# Patient Record
Sex: Female | Born: 1990 | Race: White | Hispanic: No | Marital: Married | Smoking: Never smoker
Health system: Southern US, Community
[De-identification: ages and names within clinical notes are randomized; demographics above are authoritative.]

## PROBLEM LIST (undated history)

## (undated) DIAGNOSIS — Z789 Other specified health status: Secondary | ICD-10-CM

## (undated) HISTORY — PX: INDUCED ABORTION: SHX677

## (undated) HISTORY — PX: TONSILLECTOMY: SUR1361

## (undated) HISTORY — PX: OTHER SURGICAL HISTORY: SHX169

---
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
## 2012-09-05 ENCOUNTER — Emergency Department: Payer: Self-pay | Admitting: Emergency Medicine

## 2012-09-06 LAB — CBC
HGB: 12.7 g/dL (ref 12.0–16.0)
MCH: 29 pg (ref 26.0–34.0)
MCV: 85 fL (ref 80–100)
Platelet: 224 10*3/uL (ref 150–440)
WBC: 11.5 10*3/uL — ABNORMAL HIGH (ref 3.6–11.0)

## 2012-09-06 LAB — COMPREHENSIVE METABOLIC PANEL
Albumin: 3.5 g/dL (ref 3.4–5.0)
Alkaline Phosphatase: 62 U/L (ref 50–136)
BUN: 7 mg/dL (ref 7–18)
Creatinine: 0.68 mg/dL (ref 0.60–1.30)
EGFR (Non-African Amer.): >60
Glucose: 71 mg/dL (ref 65–99)
SGOT(AST): 15 U/L (ref 15–37)
Sodium: 137 mmol/L (ref 136–145)
Total Protein: 7.6 g/dL (ref 6.4–8.2)

## 2012-09-22 ENCOUNTER — Encounter: Payer: Self-pay | Admitting: Family Medicine

## 2012-10-22 ENCOUNTER — Encounter: Payer: Self-pay | Admitting: Family Medicine

## 2013-02-06 ENCOUNTER — Ambulatory Visit: Payer: Self-pay | Admitting: Orthopedic Surgery
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
## 2016-09-26 ENCOUNTER — Encounter: Payer: Self-pay | Admitting: Obstetrics & Gynecology

## 2016-10-04 ENCOUNTER — Other Ambulatory Visit: Payer: Self-pay | Admitting: Certified Nurse Midwife

## 2016-10-04 MED ORDER — ETONOGESTREL-ETHINYL ESTRADIOL 0.12-0.015 MG/24HR VA RING: 1.0000 | VAGINAL_RING | VAGINAL | 0 refills | Status: DC

## 2016-10-04 MED ORDER — ETONOGESTREL-ETHINYL ESTRADIOL 0.12-0.015 MG/24HR VA RING: VAGINAL_RING | VAGINAL | 12 refills | Status: DC

## 2016-10-04 NOTE — Telephone Encounter (Signed)
Tried to call patient to let her know rx sent in for one month. She needs to make follow up appt regarding birth control and needs pap due to unable to do at last visit. Pt's voicemail full.

## 2016-10-04 NOTE — Telephone Encounter (Signed)
Pt is calling today about her prescription she has lost and needs a refill on her Nurvaring. Pt uses [facility] on [address]. Please advise. cb# [phone]

## 2016-10-09 ENCOUNTER — Telehealth: Payer: Self-pay

## 2016-10-09 ENCOUNTER — Other Ambulatory Visit: Payer: Self-pay | Admitting: Certified Nurse Midwife

## 2016-10-09 ENCOUNTER — Ambulatory Visit: Payer: Self-pay | Admitting: Obstetrics & Gynecology

## 2016-10-09 NOTE — Telephone Encounter (Signed)
Refill this morning was denied due to it was a request for birth control pills. Pt received rx for nuva ring on 10/04/16 with 12 refills. Pt aware.

## 2016-10-09 NOTE — Telephone Encounter (Signed)
Pt calling re med refill was denied this am b/c she needs a f/u pap smear.  Her mom told her she only needed a pap q32yrs.  Was able to have annual except the pap.  If that's the case, she would rather not take time off work and pay a copay to come in.  Please call and advise.  [phone]

## 2017-11-17 ENCOUNTER — Other Ambulatory Visit: Payer: Self-pay | Admitting: Certified Nurse Midwife

## 2017-11-21 ENCOUNTER — Ambulatory Visit: Payer: Self-pay | Admitting: Obstetrics & Gynecology

## 2017-12-04 DIAGNOSIS — M5386 Other specified dorsopathies, lumbar region: Secondary | ICD-10-CM | POA: Diagnosis not present

## 2017-12-04 DIAGNOSIS — M9903 Segmental and somatic dysfunction of lumbar region: Secondary | ICD-10-CM | POA: Diagnosis not present

## 2017-12-04 DIAGNOSIS — M5417 Radiculopathy, lumbosacral region: Secondary | ICD-10-CM | POA: Diagnosis not present

## 2017-12-09 DIAGNOSIS — M9903 Segmental and somatic dysfunction of lumbar region: Secondary | ICD-10-CM | POA: Diagnosis not present

## 2017-12-09 DIAGNOSIS — M5386 Other specified dorsopathies, lumbar region: Secondary | ICD-10-CM | POA: Diagnosis not present

## 2017-12-09 DIAGNOSIS — M5417 Radiculopathy, lumbosacral region: Secondary | ICD-10-CM | POA: Diagnosis not present

## 2017-12-11 ENCOUNTER — Ambulatory Visit (INDEPENDENT_AMBULATORY_CARE_PROVIDER_SITE_OTHER): Payer: 59 | Admitting: Obstetrics & Gynecology

## 2017-12-11 ENCOUNTER — Encounter: Payer: Self-pay | Admitting: Obstetrics & Gynecology

## 2017-12-11 VITALS — BP 120/80 | Ht 64.5 in | Wt 208.0 lb

## 2017-12-11 DIAGNOSIS — Z0000 Encounter for general adult medical examination without abnormal findings: Secondary | ICD-10-CM

## 2017-12-11 DIAGNOSIS — Z124 Encounter for screening for malignant neoplasm of cervix: Secondary | ICD-10-CM

## 2017-12-11 DIAGNOSIS — Z01419 Encounter for gynecological examination (general) (routine) without abnormal findings: Secondary | ICD-10-CM | POA: Diagnosis not present

## 2017-12-11 MED ORDER — ETONOGESTREL-ETHINYL ESTRADIOL 0.12-0.015 MG/24HR VA RING: VAGINAL_RING | VAGINAL | 3 refills | Status: DC

## 2017-12-11 NOTE — Patient Instructions (Signed)
Ethinyl Estradiol; Etonogestrel vaginal ring What is this medicine? ETHINYL ESTRADIOL; ETONOGESTREL (ETH in il es tra DYE ole; et oh noe JES trel) vaginal ring is a flexible, vaginal ring used as a contraceptive (birth control method). This medicine combines two types of female hormones, an estrogen and a progestin. This ring is used to prevent ovulation and pregnancy. Each ring is effective for one month. This medicine may be used for other purposes; ask your health care provider or pharmacist if you have questions. COMMON BRAND NAME(S): NuvaRing What should I tell my health care provider before I take this medicine? They need to know if you have or ever had any of these conditions: -abnormal vaginal bleeding -blood vessel disease or blood clots -breast, cervical, endometrial, ovarian, liver, or uterine cancer -diabetes -gallbladder disease -heart disease or recent heart attack -high blood pressure -high cholesterol -kidney disease -liver disease -migraine headaches -stroke -systemic lupus erythematosus (SLE) -tobacco smoker -an unusual or allergic reaction to estrogens, progestins, other medicines, foods, dyes, or preservatives -pregnant or trying to get pregnant -breast-feeding How should I use this medicine? Insert the ring into your vagina as directed. Follow the directions on the prescription label. The ring will remain place for 3 weeks and is then removed for a 1-week break. A new ring is inserted 1 week after the last ring was removed, on the same day of the week. Check often to make sure the ring is still in place, especially before and after sexual intercourse. If the ring was out of the vagina for an unknown amount of time, you may not be protected from pregnancy. Perform a pregnancy test and call your doctor. Do not use more often than directed. A patient package insert for the product will be given with each prescription and refill. Read this sheet carefully each time. The  sheet may change frequently. Contact your pediatrician regarding the use of this medicine in children. Special care may be needed. This medicine has been used in female children who have started having menstrual periods. Overdosage: If you think you have taken too much of this medicine contact a poison control center or emergency room at once. NOTE: This medicine is only for you. Do not share this medicine with others. What if I miss a dose? You will need to replace your vaginal ring once a month as directed. If the ring should slip out, or if you leave it in longer or shorter than you should, contact your health care professional for advice. What may interact with this medicine? Do not take this medicine with the following medication: -dasabuvir; ombitasvir; paritaprevir; ritonavir -ombitasvir; paritaprevir; ritonavir This medicine may also interact with the following medications: -acetaminophen -antibiotics or medicines for infections, especially rifampin, rifabutin, rifapentine, and griseofulvin, and possibly penicillins or tetracyclines -aprepitant -ascorbic acid (vitamin C) -atorvastatin -barbiturate medicines, such as phenobarbital -bosentan -carbamazepine -caffeine -clofibrate -cyclosporine -dantrolene -doxercalciferol -felbamate -grapefruit juice -hydrocortisone -medicines for anxiety or sleeping problems, such as diazepam or temazepam -medicines for diabetes, including pioglitazone -modafinil -mycophenolate -nefazodone -oxcarbazepine -phenytoin -prednisolone -ritonavir or other medicines for HIV infection or AIDS -rosuvastatin -selegiline -soy isoflavones supplements -St. John's wort -tamoxifen or raloxifene -theophylline -thyroid hormones -topiramate -warfarin This list may not describe all possible interactions. Give your health care provider a list of all the medicines, herbs, non-prescription drugs, or dietary supplements you use. Also tell them if you smoke,  drink alcohol, or use illegal drugs. Some items may interact with your medicine. What should I watch for while using   this medicine? Visit your doctor or health care professional for regular checks on your progress. You will need a regular breast and pelvic exam and Pap smear while on this medicine. Use an additional method of contraception during the first cycle that you use this ring. Do not use a diaphragm or female condom, as the ring can interfere with these birth control methods and their proper placement. If you have any reason to think you are pregnant, stop using this medicine right away and contact your doctor or health care professional. If you are using this medicine for hormone related problems, it may take several cycles of use to see improvement in your condition. Smoking increases the risk of getting a blood clot or having a stroke while you are using hormonal birth control, especially if you are more than 27 years old. You are strongly advised not to smoke. This medicine can make your body retain fluid, making your fingers, hands, or ankles swell. Your blood pressure can go up. Contact your doctor or health care professional if you feel you are retaining fluid. This medicine can make you more sensitive to the sun. Keep out of the sun. If you cannot avoid being in the sun, wear protective clothing and use sunscreen. Do not use sun lamps or tanning beds/booths. If you wear contact lenses and notice visual changes, or if the lenses begin to feel uncomfortable, consult your eye care specialist. In some women, tenderness, swelling, or minor bleeding of the gums may occur. Notify your dentist if this happens. Brushing and flossing your teeth regularly may help limit this. See your dentist regularly and inform your dentist of the medicines you are taking. If you are going to have elective surgery, you may need to stop using this medicine before the surgery. Consult your health care professional  for advice. This medicine does not protect you against HIV infection (AIDS) or any other sexually transmitted diseases. What side effects may I notice from receiving this medicine? Side effects that you should report to your doctor or health care professional as soon as possible: -breast tissue changes or discharge -changes in vaginal bleeding during your period or between your periods -chest pain -coughing up blood -dizziness or fainting spells -headaches or migraines -leg, arm or groin pain -severe or sudden headaches -stomach pain (severe) -sudden shortness of breath -sudden loss of coordination, especially on one side of the body -speech problems -symptoms of vaginal infection like itching, irritation or unusual discharge -tenderness in the upper abdomen -vomiting -weakness or numbness in the arms or legs, especially on one side of the body -yellowing of the eyes or skin Side effects that usually do not require medical attention (report to your doctor or health care professional if they continue or are bothersome): -breakthrough bleeding and spotting that continues beyond the 3 initial cycles of pills -breast tenderness -mood changes, anxiety, depression, frustration, anger, or emotional outbursts -increased sensitivity to sun or ultraviolet light -nausea -skin rash, acne, or brown spots on the skin -weight gain (slight) This list may not describe all possible side effects. Call your doctor for medical advice about side effects. You may report side effects to FDA at 1-800-FDA-1088. Where should I keep my medicine? Keep out of the reach of children. Store at room temperature between 15 and 30 degrees C (59 and 86 degrees F) for up to 4 months. The product will expire after 4 months. Protect from light. Throw away any unused medicine after the expiration date. NOTE: This   sheet is a summary. It may not cover all possible information. If you have questions about this medicine, talk  to your doctor, pharmacist, or health care provider.  2018 Elsevier/Gold Standard (2016-02-16 17:00:31)  

## 2017-12-11 NOTE — Progress Notes (Signed)
HPI:      Lauren Duran is a 27 y.o. G1P0010 who LMP was Patient's last menstrual period was 11/27/2017., she presents today for her annual examination. The patient has no complaints today. The patient is sexually active. Her last pap: was normal. The patient does perform self breast exams.  There is no notable family history of breast or ovarian cancer in her family.  The patient has regular exercise: yes.  The patient denies current symptoms of depression.    GYN History: Contraception: NuvaRing vaginal inserts  PMHx: History reviewed. No pertinent past medical history. Past Surgical History:  Procedure Laterality Date  . INDUCED ABORTION    . TONSILLECTOMY     Family History  Problem Relation Age of Onset  . Heart disease Father   . Hypertension Father   . Heart disease Maternal Grandfather    Social History   Tobacco Use  . Smoking status: Never Smoker  . Smokeless tobacco: Never Used  Substance Use Topics  . Alcohol use: No  . Drug use: No    Current Outpatient Medications:  .  etonogestrel-ethinyl estradiol (NUVARING) 0.12-0.015 MG/24HR vaginal ring, Insert vaginally and leave in place for 3 consecutive weeks, then remove for 1 week., Disp: 3 each, Rfl: 3 Allergies: Patient has no known allergies.  Review of Systems  Constitutional: Negative for chills, fever and malaise/fatigue.  HENT: Negative for congestion, sinus pain and sore throat.   Eyes: Negative for blurred vision and pain.  Respiratory: Negative for cough and wheezing.   Cardiovascular: Negative for chest pain and leg swelling.  Gastrointestinal: Negative for abdominal pain, constipation, diarrhea, heartburn, nausea and vomiting.  Genitourinary: Negative for dysuria, frequency, hematuria and urgency.  Musculoskeletal: Negative for back pain, joint pain, myalgias and neck pain.  Skin: Negative for itching and rash.  Neurological: Negative for dizziness, tremors and weakness.    Endo/Heme/Allergies: Does not bruise/bleed easily.  Psychiatric/Behavioral: Negative for depression. The patient is not nervous/anxious and does not have insomnia.     Objective: BP 120/80   Ht 5' 4.5" (1.638 m)   Wt 208 lb (94.3 kg)   LMP 11/27/2017   BMI 35.15 kg/m   Filed Weights   12/11/17 0815  Weight: 208 lb (94.3 kg)   Body mass index is 35.15 kg/m. Physical Exam  Constitutional: She is oriented to person, place, and time. She appears well-developed and well-nourished. No distress.  Genitourinary: Rectum normal, vagina normal and uterus normal. Pelvic exam was performed with patient supine. There is no rash or lesion on the right labia. There is no rash or lesion on the left labia. Vagina exhibits no lesion. No bleeding in the vagina. Right adnexum does not display mass and does not display tenderness. Left adnexum does not display mass and does not display tenderness. Cervix does not exhibit motion tenderness, lesion, friability or polyp.   Uterus is mobile and midaxial. Uterus is not enlarged or exhibiting a mass.  HENT:  Head: Normocephalic and atraumatic. Head is without laceration.  Right Ear: Hearing normal.  Left Ear: Hearing normal.  Nose: No epistaxis.  No foreign bodies.  Mouth/Throat: Uvula is midline, oropharynx is clear and moist and mucous membranes are normal.  Eyes: Pupils are equal, round, and reactive to light.  Neck: Normal range of motion. Neck supple. No thyromegaly present.  Cardiovascular: Normal rate and regular rhythm. Exam reveals no gallop and no friction rub.  No murmur heard. Pulmonary/Chest: Effort normal and breath sounds normal. No respiratory distress.  She has no wheezes. Right breast exhibits no mass, no skin change and no tenderness. Left breast exhibits no mass, no skin change and no tenderness.  Abdominal: Soft. Bowel sounds are normal. She exhibits no distension. There is no tenderness. There is no rebound.  Musculoskeletal: Normal range  of motion.  Neurological: She is alert and oriented to person, place, and time. No cranial nerve deficit.  Skin: Skin is warm and dry.  Psychiatric: She has a normal mood and affect. Judgment normal.  Vitals reviewed.   Assessment:  ANNUAL EXAM 1. Annual physical exam   2. Screening for cervical cancer      Screening Plan:            1.  Cervical Screening-  Pap smear done today  2. Breast screening- Exam annually and mammogram>40 planned   3. Colonoscopy every 10 years, Hemoccult testing - after age 11  4. Labs managed by PCP  5. Counseling for contraception: NuvaRing vaginal inserts     F/U  No follow-ups on file.  Annamarie Major, MD, [facility], [facility] 12/11/2017  8:41 AM

## 2017-12-15 DIAGNOSIS — M9903 Segmental and somatic dysfunction of lumbar region: Secondary | ICD-10-CM | POA: Diagnosis not present

## 2017-12-15 DIAGNOSIS — M5386 Other specified dorsopathies, lumbar region: Secondary | ICD-10-CM | POA: Diagnosis not present

## 2017-12-15 DIAGNOSIS — M5417 Radiculopathy, lumbosacral region: Secondary | ICD-10-CM | POA: Diagnosis not present

## 2017-12-15 LAB — IGP, RFX APTIMA HPV ASCU: PAP Smear Comment: 0

## 2017-12-18 DIAGNOSIS — M5386 Other specified dorsopathies, lumbar region: Secondary | ICD-10-CM | POA: Diagnosis not present

## 2017-12-18 DIAGNOSIS — M5417 Radiculopathy, lumbosacral region: Secondary | ICD-10-CM | POA: Diagnosis not present

## 2017-12-18 DIAGNOSIS — M9903 Segmental and somatic dysfunction of lumbar region: Secondary | ICD-10-CM | POA: Diagnosis not present

## 2017-12-24 DIAGNOSIS — M9903 Segmental and somatic dysfunction of lumbar region: Secondary | ICD-10-CM | POA: Diagnosis not present

## 2017-12-24 DIAGNOSIS — M9905 Segmental and somatic dysfunction of pelvic region: Secondary | ICD-10-CM | POA: Diagnosis not present

## 2017-12-24 DIAGNOSIS — M5386 Other specified dorsopathies, lumbar region: Secondary | ICD-10-CM | POA: Diagnosis not present

## 2018-05-12 DIAGNOSIS — H6691 Otitis media, unspecified, right ear: Secondary | ICD-10-CM | POA: Diagnosis not present

## 2018-05-12 DIAGNOSIS — J069 Acute upper respiratory infection, unspecified: Secondary | ICD-10-CM | POA: Diagnosis not present

## 2018-06-03 DIAGNOSIS — J301 Allergic rhinitis due to pollen: Secondary | ICD-10-CM | POA: Diagnosis not present

## 2018-08-06 DIAGNOSIS — M9904 Segmental and somatic dysfunction of sacral region: Secondary | ICD-10-CM | POA: Diagnosis not present

## 2018-08-06 DIAGNOSIS — M5442 Lumbago with sciatica, left side: Secondary | ICD-10-CM | POA: Diagnosis not present

## 2018-08-06 DIAGNOSIS — M9903 Segmental and somatic dysfunction of lumbar region: Secondary | ICD-10-CM | POA: Diagnosis not present

## 2018-08-07 DIAGNOSIS — M5442 Lumbago with sciatica, left side: Secondary | ICD-10-CM | POA: Diagnosis not present

## 2018-08-07 DIAGNOSIS — M9903 Segmental and somatic dysfunction of lumbar region: Secondary | ICD-10-CM | POA: Diagnosis not present

## 2018-08-07 DIAGNOSIS — M9904 Segmental and somatic dysfunction of sacral region: Secondary | ICD-10-CM | POA: Diagnosis not present

## 2018-08-11 DIAGNOSIS — M9903 Segmental and somatic dysfunction of lumbar region: Secondary | ICD-10-CM | POA: Diagnosis not present

## 2018-08-11 DIAGNOSIS — M5442 Lumbago with sciatica, left side: Secondary | ICD-10-CM | POA: Diagnosis not present

## 2018-08-11 DIAGNOSIS — M9904 Segmental and somatic dysfunction of sacral region: Secondary | ICD-10-CM | POA: Diagnosis not present

## 2018-08-13 DIAGNOSIS — M5442 Lumbago with sciatica, left side: Secondary | ICD-10-CM | POA: Diagnosis not present

## 2018-08-13 DIAGNOSIS — M9903 Segmental and somatic dysfunction of lumbar region: Secondary | ICD-10-CM | POA: Diagnosis not present

## 2018-08-13 DIAGNOSIS — M9904 Segmental and somatic dysfunction of sacral region: Secondary | ICD-10-CM | POA: Diagnosis not present

## 2018-11-06 ENCOUNTER — Ambulatory Visit (INDEPENDENT_AMBULATORY_CARE_PROVIDER_SITE_OTHER): Payer: 59

## 2018-11-06 ENCOUNTER — Ambulatory Visit: Payer: 59 | Admitting: Podiatry

## 2018-11-06 ENCOUNTER — Encounter: Payer: Self-pay | Admitting: Podiatry

## 2018-11-06 ENCOUNTER — Other Ambulatory Visit: Payer: Self-pay

## 2018-11-06 VITALS — Temp 98.5°F

## 2018-11-06 DIAGNOSIS — M722 Plantar fascial fibromatosis: Secondary | ICD-10-CM

## 2018-11-06 DIAGNOSIS — M2170 Unequal limb length (acquired), unspecified site: Secondary | ICD-10-CM | POA: Diagnosis not present

## 2018-11-06 DIAGNOSIS — M21611 Bunion of right foot: Secondary | ICD-10-CM

## 2018-11-06 MED ORDER — METHYLPREDNISOLONE 4 MG PO TBPK: ORAL_TABLET | ORAL | 0 refills | Status: DC

## 2018-11-06 MED ORDER — MELOXICAM 15 MG PO TABS: 15.0000 mg | ORAL_TABLET | Freq: Every day | ORAL | 1 refills | Status: DC

## 2018-11-09 NOTE — Progress Notes (Signed)
   Subjective: 28 year old female with PMHx of cerebral palsy presenting today as a new patient with a chief complaint of bilateral foot pain that has been ongoing for the past several years. 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 She states in the last 6 months the pain in her posterior left heel and lateral left foot has started to become more constant. She also reports pain in the right foot secondary to a bunion deformity. She states the pain in the feet is worse in the morning when she first wakes up and by the end of the day. Being on the feet increases the pain. She has not had any recent treatment for her symptoms. Patient is here for further evaluation and treatment.   History reviewed. No pertinent past medical history.    Objective: Physical Exam General: The patient is alert and oriented x3 in no acute distress.  Dermatology: Skin is cool, dry and supple bilateral lower extremities. Negative for open lesions or macerations.  Vascular: Palpable pedal pulses bilaterally. No edema or erythema noted. Capillary refill within normal limits.  Neurological: Epicritic and protective threshold grossly intact bilaterally.   Musculoskeletal Exam: Clinical evidence of bunion deformity noted to the respective foot. There is moderate pain on palpation range of motion of the first MPJ. Lateral deviation of the hallux noted consistent with hallux abductovalgus. Hammertoe contracture also noted on clinical exam to digits 2 and 3 of the right foot. Symptomatic pain on palpation and range of motion also noted to the metatarsal phalangeal joints of the respective hammertoe digits.   Tenderness to palpation to the plantar aspect of the left heel along the plantar fascia. Right lower extremity approximately 1 cm shorter than the left lower extremity.   Radiographic Exam: Increased intermetatarsal angle greater than 15 with a hallux  abductus angle greater than 30 noted on AP view. Moderate degenerative changes noted within the first MPJ. Contracture deformity also noted to the interphalangeal joints and MPJs of the digits of the respective hammertoes.    Assessment: 1. HAV w/ bunion deformity right 2. Hammertoe deformity digits 2, 3 right 3. Plantar fasciitis left 4. 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 H/o CP affecting RLE    Plan of Care:  1. Patient was evaluated. X-Rays reviewed. 2. Injection of 0.5 mLs Celestone Soluspan injected into the left plantar fascia.  3. Prescription for Medrol Dose Pak provided to patient. 4. Prescription for Meloxicam provided to patient. 5. Appointment with Raiford Noble, Pedorthist, for custom molded orthotics.  6. Plantar fascial brace dispensed.  7. Return to clinic in 4 weeks.   Works for [employer] Getting married in September.    Felecia Shelling, DPM [facility]  Dr. Felecia Shelling, DPM    [address]                                        [place], [place] [address]                Office [phone]  Fax [phone]

## 2018-11-11 ENCOUNTER — Other Ambulatory Visit: Payer: Self-pay

## 2018-11-11 ENCOUNTER — Other Ambulatory Visit: Payer: 59 | Admitting: Orthotics

## 2018-11-11 ENCOUNTER — Ambulatory Visit (INDEPENDENT_AMBULATORY_CARE_PROVIDER_SITE_OTHER): Payer: 59 | Admitting: Orthotics

## 2018-11-11 DIAGNOSIS — M722 Plantar fascial fibromatosis: Secondary | ICD-10-CM

## 2018-11-11 DIAGNOSIS — M21611 Bunion of right foot: Secondary | ICD-10-CM

## 2018-11-11 DIAGNOSIS — M2170 Unequal limb length (acquired), unspecified site: Secondary | ICD-10-CM

## 2018-11-11 NOTE — Progress Notes (Signed)
Patient came into today for casting bilateral f/o to address plantar fasciitis.  Patient reports history of foot pain involving plantar aponeurosis.  Goal is to provide longitudinal arch support and correct any RF instability due to heel eversion/inversion.  Ultimate goal is to relieve tension at pf insertion calcaneal tuberosity.  Plan on semi-rigid device addressing heel stability and relieving PF tension.     Patient also has 1/4" left leg length discrency; so will raise right 1/4" and also offload RT MPJ.Marland Kitchen

## 2018-12-02 ENCOUNTER — Other Ambulatory Visit: Payer: 59 | Admitting: Orthotics

## 2018-12-08 ENCOUNTER — Other Ambulatory Visit: Payer: Self-pay | Admitting: Obstetrics & Gynecology

## 2018-12-09 ENCOUNTER — Ambulatory Visit: Payer: 59 | Admitting: Orthotics

## 2018-12-09 ENCOUNTER — Other Ambulatory Visit: Payer: Self-pay

## 2018-12-09 DIAGNOSIS — M2170 Unequal limb length (acquired), unspecified site: Secondary | ICD-10-CM

## 2018-12-09 DIAGNOSIS — M722 Plantar fascial fibromatosis: Secondary | ICD-10-CM

## 2018-12-09 NOTE — Progress Notes (Signed)
Patient came in today to pick up custom made foot orthotics.  The goals were accomplished and the patient reported no dissatisfaction with said orthotics.  Patient was advised of breakin period and how to report any issues. 

## 2018-12-11 ENCOUNTER — Ambulatory Visit: Payer: 59 | Admitting: Podiatry

## 2018-12-28 ENCOUNTER — Ambulatory Visit: Payer: 59 | Admitting: Obstetrics & Gynecology

## 2018-12-30 ENCOUNTER — Ambulatory Visit: Payer: 59 | Admitting: Obstetrics & Gynecology

## 2018-12-31 ENCOUNTER — Other Ambulatory Visit: Payer: Self-pay | Admitting: Podiatry

## 2018-12-31 NOTE — Telephone Encounter (Signed)
Called pt. Advising of Meloxicam refill sent to her pharmacy

## 2018-12-31 NOTE — Telephone Encounter (Signed)
Dr. Price as Dr. On call please advice 

## 2019-02-04 ENCOUNTER — Ambulatory Visit: Payer: 59 | Admitting: Obstetrics & Gynecology

## 2019-03-03 ENCOUNTER — Other Ambulatory Visit: Payer: Self-pay | Admitting: Podiatry

## 2019-03-23 ENCOUNTER — Telehealth: Payer: Self-pay | Admitting: Obstetrics & Gynecology

## 2019-03-23 ENCOUNTER — Other Ambulatory Visit: Payer: Self-pay

## 2019-03-23 MED ORDER — ETONOGESTREL-ETHINYL ESTRADIOL 0.12-0.015 MG/24HR VA RING: VAGINAL_RING | VAGINAL | 3 refills | Status: DC

## 2019-03-23 NOTE — Telephone Encounter (Signed)
Done

## 2019-03-23 NOTE — Telephone Encounter (Signed)
Patient is schedule for 04/30/19 with [facility] for annual. Patient is requesting refill on nuvaring. Please advise

## 2019-04-19 ENCOUNTER — Ambulatory Visit: Payer: 59 | Admitting: Obstetrics & Gynecology

## 2019-04-30 ENCOUNTER — Other Ambulatory Visit: Payer: Self-pay

## 2019-04-30 ENCOUNTER — Other Ambulatory Visit (HOSPITAL_COMMUNITY)
Admission: RE | Admit: 2019-04-30 | Discharge: 2019-04-30 | Disposition: A | Discharge status: Home / Self Care | Payer: 59 | Source: Ambulatory Visit | Attending: Obstetrics & Gynecology | Admitting: Obstetrics & Gynecology

## 2019-04-30 ENCOUNTER — Ambulatory Visit (INDEPENDENT_AMBULATORY_CARE_PROVIDER_SITE_OTHER): Payer: 59 | Admitting: Obstetrics & Gynecology

## 2019-04-30 ENCOUNTER — Encounter: Payer: Self-pay | Admitting: Obstetrics & Gynecology

## 2019-04-30 VITALS — BP 128/80 | Ht 66.0 in | Wt 224.0 lb

## 2019-04-30 DIAGNOSIS — Z124 Encounter for screening for malignant neoplasm of cervix: Secondary | ICD-10-CM | POA: Insufficient documentation

## 2019-04-30 DIAGNOSIS — Z01419 Encounter for gynecological examination (general) (routine) without abnormal findings: Secondary | ICD-10-CM

## 2019-04-30 DIAGNOSIS — Z131 Encounter for screening for diabetes mellitus: Secondary | ICD-10-CM

## 2019-04-30 DIAGNOSIS — Z13220 Encounter for screening for lipoid disorders: Secondary | ICD-10-CM

## 2019-04-30 DIAGNOSIS — Z1329 Encounter for screening for other suspected endocrine disorder: Secondary | ICD-10-CM

## 2019-04-30 MED ORDER — ETONOGESTREL-ETHINYL ESTRADIOL 0.12-0.015 MG/24HR VA RING: VAGINAL_RING | VAGINAL | 3 refills | Status: DC

## 2019-04-30 NOTE — Progress Notes (Signed)
HPI:      [name] is a 28 y.o. G1P0010 who LMP was Patient's last menstrual period was 04/09/2019., she presents today for her annual examination. The patient has no complaints today. The patient is sexually active. Her last pap: approximate date 2019 and was normal. The patient does perform self breast exams.  There is no notable family history of breast or ovarian cancer in her family.  The patient has regular exercise: yes.  The patient denies current symptoms of depression.    GYN History: Contraception: NuvaRing vaginal inserts  PMHx: History reviewed. No pertinent past medical history. Past Surgical History:  Procedure Laterality Date  . INDUCED ABORTION    . TONSILLECTOMY     Family History  Problem Relation Age of Onset  . Heart disease Father   . Hypertension Father   . Heart disease Maternal Grandfather    Social History   Tobacco Use  . Smoking status: Never Smoker  . Smokeless tobacco: Never Used  Substance Use Topics  . Alcohol use: No  . Drug use: No    Current Outpatient Medications:  .  etonogestrel-ethinyl estradiol (NUVARING) 0.12-0.015 MG/24HR vaginal ring, INSERT 1 RING VAGINALLY AS DIRECTED. REMOVE AFTER 3 WEEKS & WAIT 7 DAYS BEFORE INSERTING A NEW RING, Disp: 3 each, Rfl: 3 .  meloxicam (MOBIC) 15 MG tablet, TAKE 1 TABLET BY MOUTH EVERY DAY (Patient not taking: Reported on 04/30/2019), Disp: 30 tablet, Rfl: 1 .  methylPREDNISolone (MEDROL DOSEPAK) 4 MG TBPK tablet, 6 day dose pack - take as directed (Patient not taking: Reported on 04/30/2019), Disp: 21 tablet, Rfl: 0 Allergies: Patient has no known allergies.  Review of Systems  Constitutional: Negative for chills, fever and malaise/fatigue.  HENT: Negative for congestion, sinus pain and sore throat.   Eyes: Negative for blurred vision and pain.  Respiratory: Negative for cough and wheezing.   Cardiovascular: Negative for chest pain and leg swelling.  Gastrointestinal: Negative for  abdominal pain, constipation, diarrhea, heartburn, nausea and vomiting.  Genitourinary: Negative for dysuria, frequency, hematuria and urgency.  Musculoskeletal: Negative for back pain, joint pain, myalgias and neck pain.  Skin: Negative for itching and rash.  Neurological: Negative for dizziness, tremors and weakness.  Endo/Heme/Allergies: Does not bruise/bleed easily.  Psychiatric/Behavioral: Negative for depression. The patient is not nervous/anxious and does not have insomnia.     Objective: BP 128/80   Ht 5\' 6"  (1.676 m)   Wt 224 lb (101.6 kg)   LMP 04/09/2019   BMI 36.15 kg/m   Filed Weights   04/30/19 0758  Weight: 224 lb (101.6 kg)   Body mass index is 36.15 kg/m. Physical Exam Constitutional:      General: She is not in acute distress.    Appearance: She is well-developed.  Genitourinary:     Pelvic exam was performed with patient supine.     Vagina, uterus and rectum normal.     No lesions in the vagina.     No vaginal bleeding.     No cervical motion tenderness, friability, lesion or polyp.     Uterus is mobile.     Uterus is not enlarged.     No uterine mass detected.    Uterus is midaxial.     No right or left adnexal mass present.     Right adnexa not tender.     Left adnexa not tender.  HENT:     Head: Normocephalic and atraumatic. No laceration.     Right  Ear: Hearing normal.     Left Ear: Hearing normal.     Mouth/Throat:     Pharynx: Uvula midline.  Eyes:     Pupils: Pupils are equal, round, and reactive to light.  Neck:     Musculoskeletal: Normal range of motion and neck supple.     Thyroid: No thyromegaly.  Cardiovascular:     Rate and Rhythm: Normal rate and regular rhythm.     Heart sounds: No murmur. No friction rub. No gallop.   Pulmonary:     Effort: Pulmonary effort is normal. No respiratory distress.     Breath sounds: Normal breath sounds. No wheezing.  Chest:     Breasts:        Right: No mass, skin change or tenderness.         Left: No mass, skin change or tenderness.  Abdominal:     General: Bowel sounds are normal. There is no distension.     Palpations: Abdomen is soft.     Tenderness: There is no abdominal tenderness. There is no rebound.  Musculoskeletal: Normal range of motion.  Neurological:     Mental Status: She is alert and oriented to person, place, and time.     Cranial Nerves: No cranial nerve deficit.  Skin:    General: Skin is warm and dry.  Psychiatric:        Judgment: Judgment normal.  Vitals signs reviewed.     Assessment:  ANNUAL EXAM 1. Women's annual routine gynecological examination   2. Screening for cervical cancer      Screening Plan:            1.  Cervical Screening-  Pap smear done today  2. Breast screening- Exam annually and mammogram>40 planned   3. Labs Ordered today  4. Counseling for contraception: NuvaRing vaginal inserts      F/U  Return in about 1 year (around 04/29/2020) for Annual.  Annamarie Major, MD, [facility], [facility] 04/30/2019  8:00 AM

## 2019-04-30 NOTE — Patient Instructions (Addendum)
PAP every three years Labs today  

## 2019-05-01 LAB — LIPID PANEL
Chol/HDL Ratio: 3.7 ratio (ref 0.0–4.4)
Cholesterol, Total: 176 mg/dL (ref 100–199)
HDL: 47 mg/dL (ref >39)
LDL Chol Calc (NIH): 104 mg/dL — ABNORMAL HIGH (ref 0–99)
Triglycerides: 144 mg/dL (ref 0–149)
VLDL Cholesterol Cal: 25 mg/dL (ref 5–40)

## 2019-05-01 LAB — TSH: TSH: 1.56 u[IU]/mL (ref 0.450–4.500)

## 2019-05-01 LAB — GLUCOSE, FASTING: Glucose, Plasma: 78 mg/dL (ref 65–99)

## 2019-05-03 LAB — CYTOLOGY - PAP: Diagnosis: NEGATIVE

## 2020-05-16 ENCOUNTER — Other Ambulatory Visit: Payer: Self-pay | Admitting: Obstetrics & Gynecology

## 2020-05-16 NOTE — Telephone Encounter (Signed)
Patient is scheduled for 07/06/19 with [facility]

## 2020-05-16 NOTE — Telephone Encounter (Signed)
Sch Annual.  Refill until then.

## 2020-07-05 ENCOUNTER — Ambulatory Visit: Payer: 59 | Admitting: Obstetrics & Gynecology

## 2020-07-21 ENCOUNTER — Ambulatory Visit: Payer: Self-pay | Admitting: Obstetrics & Gynecology

## 2020-08-01 ENCOUNTER — Other Ambulatory Visit: Payer: Self-pay | Admitting: Obstetrics & Gynecology

## 2020-08-07 ENCOUNTER — Ambulatory Visit: Payer: Self-pay | Admitting: Obstetrics & Gynecology

## 2020-10-28 ENCOUNTER — Other Ambulatory Visit: Payer: Self-pay | Admitting: Obstetrics & Gynecology

## 2020-10-30 NOTE — Telephone Encounter (Signed)
Needs appt (annual before any more refills allowed at this point.  Please arrange

## 2021-02-16 ENCOUNTER — Ambulatory Visit (INDEPENDENT_AMBULATORY_CARE_PROVIDER_SITE_OTHER): Payer: 59

## 2021-02-16 ENCOUNTER — Encounter: Payer: Self-pay | Admitting: Podiatry

## 2021-02-16 ENCOUNTER — Ambulatory Visit (INDEPENDENT_AMBULATORY_CARE_PROVIDER_SITE_OTHER): Payer: Self-pay | Admitting: Podiatry

## 2021-02-16 ENCOUNTER — Other Ambulatory Visit: Payer: Self-pay

## 2021-02-16 DIAGNOSIS — M2011 Hallux valgus (acquired), right foot: Secondary | ICD-10-CM | POA: Diagnosis not present

## 2021-02-16 DIAGNOSIS — M21611 Bunion of right foot: Secondary | ICD-10-CM | POA: Diagnosis not present

## 2021-02-16 DIAGNOSIS — Q66221 Congenital metatarsus adductus, right foot: Secondary | ICD-10-CM

## 2021-02-20 NOTE — Progress Notes (Signed)
   Subjective: 30 y.o. female PMHx cerebral palsy affecting the RLE presents today as a new patient for evaluation of symptomatic painful bunion deformities to the right foot that is been going on increasingly especially for the past year.  Patient has a longstanding history of a bunion deformity to the right great toe.  It is now hurting more in the joint and aggravated by walking and wearing shoes despite conservative treatment such as shoe gear modifications.  She has been taking ibuprofen and icing her foot as well as hot foot soaks to try to alleviate the pain.  Unfortunately it is painful consistently throughout the day now.  She presents for further treatment and evaluation and discuss possible surgery.   No past medical history on file.  Objective: Physical Exam General: The patient is alert and oriented x3 in no acute distress.  Dermatology: Skin is cool, dry and supple bilateral lower extremities. Negative for open lesions or macerations.  Vascular: Palpable pedal pulses bilaterally. No edema or erythema noted. Capillary refill within normal limits.  Neurological: Epicritic and protective threshold grossly intact bilaterally.   Musculoskeletal Exam: Clinical evidence of bunion deformity noted to the respective foot. There is moderate pain on palpation range of motion of the first MPJ. Lateral deviation of the hallux noted consistent with hallux abductovalgus.  Radiographic Exam: Increased intermetatarsal angle greater than 15 with a hallux abductus angle greater than 30 noted on AP view. Moderate degenerative changes noted within the first MPJ. 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  Assessment: 1. 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 Patient was evaluated. X-Rays reviewed. 2. Today we discussed the conservative versus surgical management of the presenting  pathology. The patient opts for surgical management. All possible complications and details of the procedure were explained. All patient questions were answered. No guarantees were expressed or implied.  I explained the Lapiplasty and Adductoplasty procedures to the patient and the need to address the metatarsus adductus to allow for more rectus foot alignment and better correction of the bunion deformity.  Patient understands. 3. Authorization for surgery was initiated today. Surgery will consist of Lapidus bunionectomy right.  Adductoplasty TMT arthrodesis right. 4.  Return to clinic 1 week postop    Felecia Shelling, DPM [facility]  Dr. Felecia Shelling, DPM    [address] [place], [place] [address]                Office [phone]  Fax [phone]

## 2021-03-27 ENCOUNTER — Emergency Department: Payer: 59

## 2021-03-27 ENCOUNTER — Other Ambulatory Visit: Payer: Self-pay

## 2021-03-27 ENCOUNTER — Emergency Department
Admission: EM | Admit: 2021-03-27 | Discharge: 2021-03-27 | Disposition: A | Discharge status: Home / Self Care | Payer: 59 | Attending: Emergency Medicine | Admitting: Emergency Medicine

## 2021-03-27 ENCOUNTER — Ambulatory Visit: Payer: 59

## 2021-03-27 DIAGNOSIS — M79605 Pain in left leg: Secondary | ICD-10-CM

## 2021-03-27 DIAGNOSIS — M79662 Pain in left lower leg: Secondary | ICD-10-CM

## 2021-03-27 LAB — CBC
HCT: 44.8 % (ref 36.0–46.0)
Hemoglobin: 15.1 g/dL — ABNORMAL HIGH (ref 12.0–15.0)
MCH: 29.3 pg (ref 26.0–34.0)
MCHC: 33.7 g/dL (ref 30.0–36.0)
MCV: 87 fL (ref 80.0–100.0)
Platelets: 291 10*3/uL (ref 150–400)
RBC: 5.15 MIL/uL — ABNORMAL HIGH (ref 3.87–5.11)
RDW: 12.4 % (ref 11.5–15.5)
WBC: 6.8 10*3/uL (ref 4.0–10.5)
nRBC: 0 % (ref 0.0–0.2)

## 2021-03-27 LAB — BASIC METABOLIC PANEL
Anion gap: 8 (ref 5–15)
BUN: 11 mg/dL (ref 6–20)
CO2: 26 mmol/L (ref 22–32)
Calcium: 9.6 mg/dL (ref 8.9–10.3)
Chloride: 105 mmol/L (ref 98–111)
Creatinine, Ser: 0.94 mg/dL (ref 0.44–1.00)
GFR, Estimated: >60 mL/min (ref >60)
Glucose, Bld: 103 mg/dL — ABNORMAL HIGH (ref 70–99)
Potassium: 4.4 mmol/L (ref 3.5–5.1)
Sodium: 139 mmol/L (ref 135–145)

## 2021-03-27 LAB — D-DIMER, QUANTITATIVE: D-Dimer, Quant: <0.27 ug/mL-FEU (ref 0.00–0.50)

## 2021-03-27 NOTE — ED Triage Notes (Signed)
Pt here with left leg pain that started 2 weeks ago but has gotten worse over time. Pt was sent here from her primary doctor's office to rule out a blood clot. Pt states that the pain started in her calf but has reached her toes.

## 2021-03-27 NOTE — ED Notes (Signed)
See triage note  presents with some pain to left leg  states pain started about 2 weeks ago  no injury  pain is posterior calf and moves to her foot  no injury

## 2021-03-27 NOTE — ED Provider Notes (Signed)
[facility] Emergency Department Provider Note   ____________________________________________   Event Date/Time   First MD Initiated Contact with Patient 03/27/21 [phone]     (approximate)  I have reviewed the triage vital signs and the nursing notes.   HISTORY  Chief Complaint Leg Pain    HPI Lauren Duran is a 30 y.o. female with no significant past medical history who presents to the ED complaining of leg pain.  Patient reports that she has had 2 weeks of increasing pain in her left calf.  She has not noticed any swelling or skin changes.  Pain has progressively moved down from her calf and will shoot into the top of her left foot as well as into her toes.  Pain is worse when she bears weight on her left leg, she denies any specific trauma but does state she ran into an obstacle course shortly before onset of pain.  She has never had similar pain in the past and denies any history of DVT/PE.  She initially presented to her PCPs office, was referred to the ED for rule out of blood clot.        No past medical history on file.  There are no problems to display for this patient.   Past Surgical History:  Procedure Laterality Date   INDUCED ABORTION     TONSILLECTOMY      Prior to Admission medications   Medication Sig Start Date End Date Taking? Authorizing Provider  etonogestrel-ethinyl estradiol (NUVARING) 0.12-0.015 MG/24HR vaginal ring INSERT 1 RING VAGINALLY AS DIRECTED. REMOVE AFTER 3 WEEKS & WAIT 7 DAYS BEFORE INSERTING A NEW RING 08/01/20   Nadara Mustard, MD  levocetirizine (XYZAL) 5 MG tablet Take 5 mg by mouth every evening.    [provider]    Allergies Red dye  Family History  Problem Relation Age of Onset   Heart disease Father    Hypertension Father    Heart disease Maternal Grandfather     Social History Social History   Tobacco Use   Smoking status: Never   Smokeless tobacco: Never  Vaping Use   Vaping  Use: Never used  Substance Use Topics   Alcohol use: No   Drug use: No    Review of Systems  Constitutional: No fever/chills Eyes: No visual changes. ENT: No sore throat. Cardiovascular: Denies chest pain. Respiratory: Denies shortness of breath. Gastrointestinal: No abdominal pain.  No nausea, no vomiting.  No diarrhea.  No constipation. Genitourinary: Negative for dysuria. Musculoskeletal: Negative for back pain.  Positive for left calf and foot pain. Skin: Negative for rash. Neurological: Negative for headaches, focal weakness or numbness.  ____________________________________________   PHYSICAL EXAM:  VITAL SIGNS: ED Triage Vitals  Enc Vitals Group     BP 03/27/21 0915 137/90     Pulse Rate 03/27/21 0915 (!) 107     Resp 03/27/21 0915 18     Temp 03/27/21 0915 98.9 F (37.2 C)     Temp Source 03/27/21 0915 Oral     SpO2 03/27/21 0915 98 %     Weight 03/27/21 0916 200 lb (90.7 kg)     Height 03/27/21 0916 5\' 4"  (1.626 m)     Head Circumference --      Peak Flow --      Pain Score 03/27/21 0916 9     Pain Loc --      Pain Edu? --      Excl. in GC? --  Constitutional: Alert and oriented. Eyes: Conjunctivae are normal. Head: Atraumatic. Nose: No congestion/rhinnorhea. Mouth/Throat: Mucous membranes are moist. Neck: Normal ROM Cardiovascular: Normal rate, regular rhythm. Grossly normal heart sounds.  2+ DP pulse on left. Respiratory: Normal respiratory effort.  No retractions. Lungs CTAB. Gastrointestinal: Soft and nontender. No distention. Genitourinary: deferred Musculoskeletal: Left posterior calf tenderness to palpation noted with no associated edema, erythema, or warmth.  No tenderness to palpation at left ankle or foot. Neurologic:  Normal speech and language. No gross focal neurologic deficits are appreciated. Skin:  Skin is warm, dry and intact. No rash noted. Psychiatric: Mood and affect are normal. Speech and behavior are  normal.  ____________________________________________   LABS (all labs ordered are listed, but only abnormal results are displayed)  Labs Reviewed  CBC - Abnormal; Notable for the following components:      Result Value   RBC 5.15 (*)    Hemoglobin 15.1 (*)    All other components within normal limits  BASIC METABOLIC PANEL - Abnormal; Notable for the following components:   Glucose, Bld 103 (*)    All other components within normal limits  D-DIMER, QUANTITATIVE    PROCEDURES  Procedure(s) performed (including Critical Care):  Procedures   ____________________________________________   INITIAL IMPRESSION / ASSESSMENT AND PLAN / ED COURSE      30 year old female with no significant past medical history presents to the ED complaining of 2 weeks of left calf pain that shoots down towards her toes and is worse with bearing weight on the left leg.  She has calf tenderness on exam but is neurovascularly intact to her distal left lower extremity.  Labs are unremarkable and ultrasound shows no evidence of DVT.  There is no evidence of infectious process and I suspect her symptoms are due to muscle strain versus peripheral neuropathy.  She is appropriate for discharge home with PCP follow-up, was counseled to rest, apply ice, and use NSAIDs.  She was counseled to return to the ED for new worsening symptoms, patient agrees with plan.      ____________________________________________   FINAL CLINICAL IMPRESSION(S) / ED DIAGNOSES  Final diagnoses:  Left leg pain  Pain of left calf     ED Discharge Orders     None        Note:  This document was prepared using Dragon voice recognition software and may include unintentional dictation errors.    Chesley Noon, MD 03/27/21 1030

## 2021-04-30 ENCOUNTER — Telehealth: Payer: Self-pay | Admitting: Urology

## 2021-04-30 NOTE — Telephone Encounter (Signed)
DOS - 05/10/21  LAPIDUS PROC. INCLUDING BUNIONECTOMY RIGHT --- [phone] [place] MIDTARSAL RIGHT --- [phone]  [facility] EFFECTIVE DATE - 06/24/20  PLAN DEDUCTIBLE -Same deductible apply to both in-Network and Out-of-Network. OUT OF POCKET - Same Out-of-Pocket apply to both in-Network and Out-of-Network. COINSURANCE - 20% COPAY - $0.00   PER [employer] [facility] FOR CPT CODES 63845 AND 28737 HAVE BEEN APPROVED, AUTH # [id number], GOOD FROM 05/10/21 - 08/08/21.

## 2021-05-10 ENCOUNTER — Other Ambulatory Visit: Payer: Self-pay | Admitting: Podiatry

## 2021-05-10 DIAGNOSIS — Q66221 Congenital metatarsus adductus, right foot: Secondary | ICD-10-CM | POA: Diagnosis not present

## 2021-05-10 DIAGNOSIS — M2011 Hallux valgus (acquired), right foot: Secondary | ICD-10-CM | POA: Diagnosis not present

## 2021-05-10 MED ORDER — PROMETHAZINE HCL 25 MG PO TABS: 25.0000 mg | ORAL_TABLET | Freq: Three times a day (TID) | ORAL | 0 refills | Status: DC | PRN

## 2021-05-10 MED ORDER — IBUPROFEN 800 MG PO TABS: 800.0000 mg | ORAL_TABLET | Freq: Three times a day (TID) | ORAL | 1 refills | Status: DC

## 2021-05-10 MED ORDER — OXYCODONE-ACETAMINOPHEN 5-325 MG PO TABS: 1.0000 | ORAL_TABLET | ORAL | 0 refills | Status: DC | PRN

## 2021-05-10 NOTE — Progress Notes (Signed)
PRN postop 

## 2021-05-14 ENCOUNTER — Other Ambulatory Visit: Payer: Self-pay | Admitting: Podiatry

## 2021-05-14 ENCOUNTER — Telehealth: Payer: Self-pay | Admitting: Podiatry

## 2021-05-14 MED ORDER — OXYCODONE-ACETAMINOPHEN 5-325 MG PO TABS: 1.0000 | ORAL_TABLET | ORAL | 0 refills | Status: DC | PRN

## 2021-05-14 NOTE — Telephone Encounter (Signed)
Refill sent to the pharmacy.  Please notify patient

## 2021-05-14 NOTE — Telephone Encounter (Signed)
Called pt let her know RX was sent over to pharmacy °

## 2021-05-14 NOTE — Progress Notes (Signed)
PRN postop pain 

## 2021-05-14 NOTE — Telephone Encounter (Signed)
Patient called she mneeds a refill of her pain medication. Pharmacy she uses is [facility]. Pt tele phone number is [phone]

## 2021-05-15 ENCOUNTER — Ambulatory Visit (INDEPENDENT_AMBULATORY_CARE_PROVIDER_SITE_OTHER): Payer: 59 | Admitting: Podiatry

## 2021-05-15 ENCOUNTER — Encounter: Payer: Self-pay | Admitting: Podiatry

## 2021-05-15 ENCOUNTER — Ambulatory Visit (INDEPENDENT_AMBULATORY_CARE_PROVIDER_SITE_OTHER): Payer: 59

## 2021-05-15 ENCOUNTER — Other Ambulatory Visit: Payer: Self-pay

## 2021-05-15 VITALS — BP 107/69 | HR 99 | Temp 97.3°F | Resp 20

## 2021-05-15 DIAGNOSIS — Z9889 Other specified postprocedural states: Secondary | ICD-10-CM

## 2021-05-15 NOTE — Progress Notes (Signed)
   Subjective:  Patient presents today status post Lapidus and adductoplasty arthrodesis right. DOS: 05/10/2021.  Patient states she is doing well.  She continues to be in significant amount of pain however there has been some improvement over the last few days.  She is kept the dressings clean dry and intact and nonweightbearing to the extremity using a knee scooter.  No past medical history on file.    Objective/Physical Exam Neurovascular status intact.  Skin incisions appear to be well coapted with sutures intact. No sign of infectious process noted. No dehiscence. No active bleeding noted. Moderate edema noted to the surgical extremity.  Radiographic Exam:  Orthopedic hardware and osteotomies sites appear to be stable with routine healing.  Good alignment of the rays 1 through 3.  Assessment: 1. s/p Lapidus and adductoplasty RT. DOS: 05/11/2019   Plan of Care:  1. Patient was evaluated. X-rays reviewed 2.  Dressings changed.  Clean dry and intact x1 week 3.  Continue nonweightbearing in the cam boot and knee scooter 4.  Continue Percocet 5/3 2 5  mg as needed 5.  Continue Motrin 800 mg as needed 3 times daily 6.  Return to clinic 1 week  *Works for [employer] Recently married 02/2019.    Felecia Shelling, DPM [facility]  Dr. Felecia Shelling, DPM    [address] [place], [place] [address]                Office [phone]  Fax [phone]

## 2021-05-18 ENCOUNTER — Encounter: Payer: Self-pay | Admitting: Podiatry

## 2021-05-18 ENCOUNTER — Other Ambulatory Visit: Payer: Self-pay | Admitting: Podiatry

## 2021-05-21 ENCOUNTER — Other Ambulatory Visit: Payer: Self-pay | Admitting: Podiatry

## 2021-05-21 ENCOUNTER — Other Ambulatory Visit: Payer: Self-pay

## 2021-05-21 DIAGNOSIS — M79604 Pain in right leg: Secondary | ICD-10-CM

## 2021-05-21 MED ORDER — PROMETHAZINE HCL 25 MG PO TABS: 25.0000 mg | ORAL_TABLET | Freq: Three times a day (TID) | ORAL | 0 refills | Status: DC | PRN

## 2021-05-21 MED ORDER — OXYCODONE-ACETAMINOPHEN 5-325 MG PO TABS: 1.0000 | ORAL_TABLET | ORAL | 0 refills | Status: DC | PRN

## 2021-05-21 NOTE — Telephone Encounter (Signed)
Please advise it been 3 days

## 2021-05-21 NOTE — Progress Notes (Signed)
Concern for DVT RLE.

## 2021-05-21 NOTE — Telephone Encounter (Signed)
Please advise 

## 2021-05-22 ENCOUNTER — Other Ambulatory Visit: Payer: Self-pay

## 2021-05-22 ENCOUNTER — Ambulatory Visit (INDEPENDENT_AMBULATORY_CARE_PROVIDER_SITE_OTHER): Payer: 59 | Admitting: Podiatry

## 2021-05-22 DIAGNOSIS — Z9889 Other specified postprocedural states: Secondary | ICD-10-CM

## 2021-05-22 MED ORDER — DOXYCYCLINE HYCLATE 100 MG PO TABS: 100.0000 mg | ORAL_TABLET | Freq: Two times a day (BID) | ORAL | 0 refills | Status: DC

## 2021-05-22 NOTE — Progress Notes (Signed)
   Subjective:  Patient presents today status post Lapidus and adductoplasty arthrodesis right. DOS: 05/10/2021.  Patient is doing well.  She did have an episode of some calf and knee pain however she believes it was possibly positional.  It has since resolved.  She has kept the dressings clean dry and intact over the past week.  She presents for follow-up treatment and evaluation  No past medical history on file.    Objective/Physical Exam Neurovascular status intact.  Skin incisions appear to be well coapted with sutures intact.  There is some slight drainage noted to the incision site overlying the first TMT joint.  No dehiscence. No active bleeding noted.  Mildly increased edema noted to the surgical extremity.  Radiographic Exam 05/15/2021:  Orthopedic hardware and osteotomies sites appear to be stable with routine healing.  Good alignment of the rays 1 through 3.  Assessment: 1. s/p Lapidus and adductoplasty RT. DOS: 05/11/2019   Plan of Care:  1. Patient was evaluated.  2.  Patient states that she no longer has any calf pain or knee pain.  She believes it was positional pain.  She did not get the venous Doppler that was ordered yesterday.  No pain with calf compression. 3.  Due to the slight increase swelling and residual drainage of the incision site over the first TMT joint I will prescribe some doxycycline 100 mg 2 times daily #20 4.  Continue Percocet and Phenergan as needed 5.  Continue strict nonweightbearing in the cam boot with the knee scooter 6.  Patient may begin washing and showering and getting the foot wet 7.  Return to clinic in 1 week for suture removal.  Patient may begin weightbearing about 2 weeks from today  *Works for [employer] Recently married 02/2019.    Felecia Shelling, DPM [facility]  Dr. Felecia Shelling, DPM    [address] [place], [place] [address]                Office [phone]  Fax [phone]

## 2021-05-24 ENCOUNTER — Ambulatory Visit
Admission: RE | Admit: 2021-05-24 | Discharge: 2021-05-24 | Disposition: A | Discharge status: Home / Self Care | Payer: 59 | Source: Ambulatory Visit | Attending: Podiatry | Admitting: Podiatry

## 2021-05-24 ENCOUNTER — Other Ambulatory Visit: Payer: Self-pay

## 2021-05-24 DIAGNOSIS — M79604 Pain in right leg: Secondary | ICD-10-CM | POA: Diagnosis not present

## 2021-05-25 ENCOUNTER — Telehealth: Payer: Self-pay | Admitting: *Deleted

## 2021-05-25 NOTE — Telephone Encounter (Signed)
"  I had surgery with Dr. Logan Bores on November 17.  I had quite a bit of swelling and bruising and drainage to where he put me on an antibiotic.  I been taking the antibiotic since Tuesday after my appointment.  It's still quite a bit swollen.  I didn't know if I needed to get in before the weekend to look at it.  I want to make sure nothing is wrong with it and get ahead of it.  Please give me a call."

## 2021-05-25 NOTE — Telephone Encounter (Signed)
Offices are closed.  I suspect that the majority of the patient's swelling and pain is coming from postoperative recovery as expected.  She is very concerned she needed the emergency department.  Thanks, Dr. Logan Bores

## 2021-05-25 NOTE — Telephone Encounter (Signed)
I'm returning your call.  Dr. Logan Bores said he suspects that the majority of your swelling and pain is coming from postoperative recovery as expected.  He said if you're concerned over the weekend to go to the emergency room.  "Actually, my husband took the bandage off last night and it looked better.  So, I feel better about it now."  Great, glad to hear.  Call us if you have any concerns.

## 2021-05-29 ENCOUNTER — Other Ambulatory Visit: Payer: Self-pay

## 2021-05-29 ENCOUNTER — Telehealth: Payer: Self-pay | Admitting: Podiatry

## 2021-05-29 ENCOUNTER — Ambulatory Visit (INDEPENDENT_AMBULATORY_CARE_PROVIDER_SITE_OTHER): Payer: 59 | Admitting: Podiatry

## 2021-05-29 ENCOUNTER — Other Ambulatory Visit: Payer: Self-pay | Admitting: Podiatry

## 2021-05-29 DIAGNOSIS — Z9889 Other specified postprocedural states: Secondary | ICD-10-CM

## 2021-05-29 DIAGNOSIS — R600 Localized edema: Secondary | ICD-10-CM

## 2021-05-29 MED ORDER — OXYCODONE-ACETAMINOPHEN 5-325 MG PO TABS: 1.0000 | ORAL_TABLET | ORAL | 0 refills | Status: DC | PRN

## 2021-05-29 NOTE — Progress Notes (Signed)
   Subjective:  Patient presents today status post Lapidus and adductoplasty arthrodesis right. DOS: 05/10/2021.  Patient is doing well.  She did have an episode of some calf and knee pain however she believes it was possibly positional.  It has since resolved.  She has kept the dressings clean dry and intact over the past week.  She presents for follow-up treatment and evaluation  No past medical history on file.    Objective/Physical Exam Neurovascular status intact.  Skin incisions appear to be well coapted with sutures intact.  There is some slight drainage noted to the incision site overlying the first TMT joint.  No dehiscence. No active bleeding noted.  Mildly increased edema noted to the surgical extremity.  Radiographic Exam 05/15/2021:  Orthopedic hardware and osteotomies sites appear to be stable with routine healing.  Good alignment of the rays 1 through 3.  Assessment: 1. s/p Lapidus and adductoplasty RT. DOS: 05/11/2019   Plan of Care:  1. Patient was evaluated.  2.  Today to address the edema of the foot a multilayer Unna boot compression soft cast was applied to the surgical extremity.  Leave clean dry and intact x1 week.  After that she may remove the wrappings and begin icing her foot to help with the swelling and edema 3.  Patient has 3 days left of the doxycycline 100 mg 2 times daily #20.  Continue until completed 4.  Continue Percocet and Phenergan as needed.  Refill of the Percocet was provided today 5.  Patient may begin partial weightbearing in the cam boot with the assistance of a walker. 6.  Return to clinic in 2 weeks for follow-up and x-ray   *Works for [employer] Recently married 02/2019.    Felecia Shelling, DPM [facility]  Dr. Felecia Shelling, DPM    [address] [place], [place] [address]                Office [phone]  Fax [phone]

## 2021-05-29 NOTE — Telephone Encounter (Signed)
Corrected. - Dr. Lakaya Tolen 

## 2021-05-29 NOTE — Telephone Encounter (Signed)
Patient called stating you sent a RX to the wrong pharmacy. [facility] [place] Pts number [phone].

## 2021-05-30 ENCOUNTER — Encounter: Payer: Self-pay | Admitting: Podiatry

## 2021-05-31 ENCOUNTER — Telehealth: Payer: Self-pay | Admitting: Podiatry

## 2021-05-31 NOTE — Telephone Encounter (Signed)
error 

## 2021-06-05 ENCOUNTER — Encounter: Payer: 59 | Admitting: Podiatry

## 2021-06-06 ENCOUNTER — Encounter: Payer: Self-pay | Admitting: Podiatry

## 2021-06-08 ENCOUNTER — Other Ambulatory Visit: Payer: Self-pay | Admitting: Podiatry

## 2021-06-08 MED ORDER — DOXYCYCLINE HYCLATE 100 MG PO TABS: 100.0000 mg | ORAL_TABLET | Freq: Two times a day (BID) | ORAL | 0 refills | Status: DC

## 2021-06-12 ENCOUNTER — Ambulatory Visit: Payer: 59

## 2021-06-12 ENCOUNTER — Other Ambulatory Visit: Payer: Self-pay

## 2021-06-12 ENCOUNTER — Ambulatory Visit (INDEPENDENT_AMBULATORY_CARE_PROVIDER_SITE_OTHER): Payer: 59 | Admitting: Podiatry

## 2021-06-12 DIAGNOSIS — Z9889 Other specified postprocedural states: Secondary | ICD-10-CM

## 2021-06-12 MED ORDER — GENTAMICIN SULFATE 0.1 % EX CREA: 1.0000 "application " | TOPICAL_CREAM | Freq: Three times a day (TID) | CUTANEOUS | 0 refills | Status: DC

## 2021-06-12 NOTE — Progress Notes (Addendum)
° °  Subjective:  Patient presents today status post Lapidus and adductoplasty arthrodesis right. DOS: 05/10/2021.  Patient is doing well.  Patient has been weightbearing in the cam boot.  She is currently on oral antibiotics due to concern of discoloration with possible infection and she called into the office and oral doxycycline was prescribed.  No new complaints at this time  No past medical history on file.    Objective/Physical Exam Neurovascular status intact.  Skin incisions appear to be well coapted and healed with exception to small focal areas along the medialmost incision site where there is well adhered eschar.  No drainage noted today.  No dehiscence. No active bleeding noted.  There continues to be some moderate edema noted to the surgical extremity.  Radiographic Exam today: Orthopedic hardware and osteotomies sites appear to be stable with routine healing.  Good alignment of the rays 1 through 3.  Overall there is no significant change from prior x-rays.  Assessment: 1. s/p Lapidus and adductoplasty RT. DOS: 05/11/2019   Plan of Care:  1. Patient was evaluated.  2.  Light debridement of any superficial loosely adhered eschar along the incision site was performed using a tissue nipper.  There are 2 small focal areas along the most medial incision site overlying the Lapidus procedure that have a well adhered eschar.  These were left intact.  3.  Silvadene cream provided to apply along the incision sites daily with a nonadherent gauze pad which was also provided.  Recommend gentamicin cream, nonadherent gauze pad, and Ace wrap daily.  Prescription for gentamicin cream sent to the pharmacy 4.  Patient may now begin full weightbearing in the cam boot.  Radiographically the x-rays appear very stable with good routine healing and she is about 5 weeks postop 5.  Continue full weightbearing in the cam boot x2 weeks.  After 2 weeks she may begin to transition of the cam boot into good  supportive shoes and sneakers 6.  Order placed for physical therapy at [facility] 7.  Clinically there is no indication or suspicion of infection.  The incision sites and foot do not demonstrate any erythema or edema or drainage.  Patient may discontinue the oral antibiotics that were prescribed since they cause grogginess and slight nausea 8.  Return to clinic in 3 weeks for follow-up x-ray.  At this time the patient will hopefully be coming in wearing good supportive shoes and sneakers   *Works for [employer] Recently married 02/2019.    Felecia Shelling, DPM [facility]  Dr. Felecia Shelling, DPM    [address] [place], [place] [address]                Office [phone]  Fax [phone]

## 2021-06-21 ENCOUNTER — Encounter: Payer: Self-pay | Admitting: Podiatry

## 2021-06-21 NOTE — Telephone Encounter (Signed)
Please advise 

## 2021-06-24 NOTE — L&D Delivery Note (Signed)
Delivery Note  First Stage: Labor onset: 1900 Augmentation: cytotec. PItocin Analgesia /Anesthesia intrapartum: epidural SROM at 2336  Second Stage: Complete dilation at 0200 Onset of pushing at 0300 FHR second stage cat II- variables with pushing  Delivery of a viable female infant on 05/30/22 at 0311 by CNM delivery of fetal head in LOA position with restitution to LOT. Loose single nuchal cord;  Anterior then posterior shoulders delivered easily with gentle downward traction. Baby placed on mom's chest, and attended to by peds.  Cord double clamped after cessation of pulsation, cut by FOB   Third Stage: Placenta delivered spontaneously intact with 3 VC @ 0319 Placenta disposition: routine disposal Uterine tone firm / bleeding small  1st deg perineal, bilateral sidewall, right labial lacerations identified  Anesthesia for repair: epidural Repair 2-0 Vicryl CT-1, 3-0 Vicryl SH Est. Blood Loss (mL): 310 QBL  Complications: none  Mom to postpartum.  Baby to Couplet care / Skin to Skin.  Newborn: Birth Weight: pending  Apgar Scores: 8/9 Feeding planned: breast

## 2021-07-03 ENCOUNTER — Ambulatory Visit (INDEPENDENT_AMBULATORY_CARE_PROVIDER_SITE_OTHER): Payer: 59

## 2021-07-03 ENCOUNTER — Encounter: Payer: Self-pay | Admitting: Podiatry

## 2021-07-03 ENCOUNTER — Other Ambulatory Visit: Payer: Self-pay

## 2021-07-03 ENCOUNTER — Ambulatory Visit (INDEPENDENT_AMBULATORY_CARE_PROVIDER_SITE_OTHER): Payer: 59 | Admitting: Podiatry

## 2021-07-03 DIAGNOSIS — M21611 Bunion of right foot: Secondary | ICD-10-CM

## 2021-07-03 DIAGNOSIS — M2011 Hallux valgus (acquired), right foot: Secondary | ICD-10-CM

## 2021-07-03 DIAGNOSIS — Z9889 Other specified postprocedural states: Secondary | ICD-10-CM

## 2021-07-04 ENCOUNTER — Other Ambulatory Visit: Payer: Self-pay | Admitting: Podiatry

## 2021-07-04 NOTE — Telephone Encounter (Signed)
Please advise 

## 2021-07-15 NOTE — Progress Notes (Signed)
° °  Subjective:  Patient presents today status post Lapidus and adductoplasty arthrodesis right. DOS: 05/10/2021.  Patient states that she is doing well.  She continues to have some associated tenderness and pain.  She is concerned because she feels as if the toes are shifting again.  She is still in physical therapy which is going well.  No new complaints at this time  No past medical history on file.  Past Surgical History:  Procedure Laterality Date   INDUCED ABORTION     TONSILLECTOMY     Allergies  Allergen Reactions   Red Dye Hives    Objective/Physical Exam Neurovascular status intact.  Skin incisions healed.  No drainage.  No dehiscence.  There continues to be some moderate edema noted to the surgical extremity.  Slight lateral deviation of the toes 1-3 at the level of the MTP joint.  Radiographic Exam today: Orthopedic hardware and osteotomies sites continue to appear to be stable with routine healing.  Good alignment of the rays 1 through 3.  Overall there is no significant change from prior x-rays.  There is some slight lateral deviation of the lesser digits as well as the hallux at the level of the MTP joint  Assessment: 1. s/p Lapidus and adductoplasty RT. DOS: 05/11/2019   Plan of Care:  1. Patient was evaluated.  2.  Radiographically the orthopedic hardware and arthrodesis sites appear very stable with good healing.  Good reduction of the IM angles and alignment of the metatarsals although there continues to be some lateral deviation of the digits at the MTP joint 3.  For now we will continue physical therapy.  Recommend daily stretching and range of motion exercises especially to the MTP joints 4.  Patient to wear good supportive shoes and sneakers 5.  A Darco toe splint was provided for the patient to help plantarflex and stretch the great toe 6.  Return to clinic in 6 weeks  *Works for [employer]. Recently married 02/2019.    Edrick Kins,  DPM [facility]  Dr. Edrick Kins, DPM    [address], [place] [address]                Office [phone]  Fax [phone]

## 2021-08-07 ENCOUNTER — Telehealth: Payer: 59

## 2021-08-14 ENCOUNTER — Ambulatory Visit (INDEPENDENT_AMBULATORY_CARE_PROVIDER_SITE_OTHER): Payer: 59

## 2021-08-14 ENCOUNTER — Other Ambulatory Visit: Payer: Self-pay

## 2021-08-14 ENCOUNTER — Encounter: Payer: Self-pay | Admitting: Podiatry

## 2021-08-14 ENCOUNTER — Ambulatory Visit (INDEPENDENT_AMBULATORY_CARE_PROVIDER_SITE_OTHER): Payer: 59 | Admitting: Podiatry

## 2021-08-14 DIAGNOSIS — Z9889 Other specified postprocedural states: Secondary | ICD-10-CM | POA: Diagnosis not present

## 2021-08-14 DIAGNOSIS — M2011 Hallux valgus (acquired), right foot: Secondary | ICD-10-CM | POA: Diagnosis not present

## 2021-08-14 DIAGNOSIS — M21611 Bunion of right foot: Secondary | ICD-10-CM

## 2021-08-14 NOTE — Progress Notes (Signed)
° °  Subjective:  Patient presents today status post Lapidus and adductoplasty arthrodesis right. DOS: 05/10/2021.  Patient is doing much better.  She states that physical therapy has helped tremendously.  She is weightbearing in tennis shoes.  She presents for further treatment evaluation  No past medical history on file.  Past Surgical History:  Procedure Laterality Date   INDUCED ABORTION     TONSILLECTOMY     Allergies  Allergen Reactions   Red Dye Hives    Objective/Physical Exam Neurovascular status intact.  Skin incisions healed.   There continues to be some moderate edema noted to the surgical extremity extending up to the level of the ankle.  Good range of motion of the MTP joints.  Negative for any significant pain on palpation.  Slight lateral deviation of the toes 1-3 at the level of the MTP joint.  Radiographic Exam today: Orthopedic hardware and osteotomies sites continue to appear to be stable with routine healing.  Good alignment of the rays 1 through 3.  Overall there is no significant change from prior x-rays.  There is some slight lateral deviation of the lesser digits as well as the hallux at the level of the MTP joint  Assessment: 1. s/p Lapidus and adductoplasty RT. DOS: 05/11/2019   Plan of Care:  1. Patient was evaluated.  2.  Radiographically the orthopedic hardware and arthrodesis sites continue to appear very stable with good healing.  Good reduction of the IM angles and alignment of the metatarsals although there continues to be some lateral deviation of the digits 1-3 at the MTP joint.  unchanged from prior x-rays 3.  Continue physical therapy.  The patient states that she has had significant improvement with the physical therapy.  They are doing dry needling in the first interspace which is helping significantly as well.  She is very satisfied with the physical therapy 4.  May continue wearing good supportive shoes and sneakers. 5.  Continue compression  ankle sleeve 6.  Return to clinic 2 months  *Works for [employer]. Recently married 02/2019.    Edrick Kins, DPM [facility]  Dr. Edrick Kins, DPM    [address], [place] [address]                Office [phone]  Fax [phone]

## 2021-09-02 ENCOUNTER — Other Ambulatory Visit: Payer: Self-pay | Admitting: Podiatry

## 2021-10-16 ENCOUNTER — Ambulatory Visit (INDEPENDENT_AMBULATORY_CARE_PROVIDER_SITE_OTHER): Payer: 59 | Admitting: Podiatry

## 2021-10-16 DIAGNOSIS — Z9889 Other specified postprocedural states: Secondary | ICD-10-CM

## 2021-10-16 NOTE — Progress Notes (Signed)
? ?  Subjective:  ?Patient presents today status post Lapidus and adductoplasty arthrodesis right. DOS: 05/10/2021.  Patient states that she is doing very well.  She completed physical therapy.  She is able to walk around and ambulate without pain.  No new complaints at this time ? ?No past medical history on file. ? ?Past Surgical History:  ?Procedure Laterality Date  ? INDUCED ABORTION    ? TONSILLECTOMY    ? ?Allergies  ?Allergen Reactions  ? Red Dye Hives  ? ? ?Objective/Physical Exam ?Neurovascular status intact.  Skin incisions healed.   There continues to be some mild edema noted to the surgical extremity extending up to the level of the ankle.  Good range of motion of the MTP joints.  Negative for any significant pain on palpation.  There continues to be some very slight slight lateral deviation of the toes 1-3 at the level of the MTP joint. ? ?Radiographic Exam 08/14/2021: ?Orthopedic hardware and osteotomies sites continue to appear to be stable with routine healing.  Good alignment of the rays 1 through 3.  Overall there is no significant change from prior x-rays.  There is some slight lateral deviation of the lesser digits as well as the hallux at the level of the MTP joint ? ?Assessment: ?1. s/p Lapidus and adductoplasty RT. DOS: 05/10/2021 ? ? ?Plan of Care:  ?1. Patient was evaluated.  ?2.  Patient states that she no longer has any pain associated to the foot even with ambulation and walking. ?3.  From a surgical standpoint the patient may resume full activity no restrictions ?4.  Continue compression ankle sleeve daily as needed any residual edema ?5.  Return to clinic as needed ? ?*Works for [employer] Recently married 02/2019.  ? ? ?Felecia Shelling, DPM ?[facility] ? ?Dr. Felecia Shelling, DPM  ?  ?[address]                                    ?[place], [place] [address]                ?Office [phone]  ?Fax [phone] ? ? ? ? ? ?

## 2021-10-24 ENCOUNTER — Ambulatory Visit: Payer: 59

## 2021-11-13 ENCOUNTER — Encounter: Payer: 59 | Admitting: Licensed Practical Nurse

## 2021-11-13 ENCOUNTER — Encounter: Payer: 59 | Admitting: Family Medicine

## 2021-11-28 DIAGNOSIS — O0993 Supervision of high risk pregnancy, unspecified, third trimester: Secondary | ICD-10-CM | POA: Insufficient documentation

## 2021-11-28 DIAGNOSIS — Z3403 Encounter for supervision of normal first pregnancy, third trimester: Secondary | ICD-10-CM | POA: Insufficient documentation

## 2022-02-27 DIAGNOSIS — O99213 Obesity complicating pregnancy, third trimester: Secondary | ICD-10-CM | POA: Insufficient documentation

## 2022-04-18 DIAGNOSIS — O26849 Uterine size-date discrepancy, unspecified trimester: Secondary | ICD-10-CM | POA: Insufficient documentation

## 2022-05-21 ENCOUNTER — Observation Stay
Admission: EM | Admit: 2022-05-21 | Discharge: 2022-05-21 | Disposition: A | Discharge status: Home / Self Care | Payer: 59 | Attending: Obstetrics and Gynecology | Admitting: Obstetrics and Gynecology

## 2022-05-21 ENCOUNTER — Other Ambulatory Visit: Payer: Self-pay

## 2022-05-21 ENCOUNTER — Encounter: Payer: Self-pay | Admitting: Obstetrics and Gynecology

## 2022-05-21 DIAGNOSIS — Z7982 Long term (current) use of aspirin: Secondary | ICD-10-CM | POA: Diagnosis not present

## 2022-05-21 DIAGNOSIS — O133 Gestational [pregnancy-induced] hypertension without significant proteinuria, third trimester: Secondary | ICD-10-CM | POA: Diagnosis present

## 2022-05-21 DIAGNOSIS — Z3A35 35 weeks gestation of pregnancy: Secondary | ICD-10-CM | POA: Insufficient documentation

## 2022-05-21 DIAGNOSIS — Z79899 Other long term (current) drug therapy: Secondary | ICD-10-CM | POA: Diagnosis not present

## 2022-05-21 DIAGNOSIS — O163 Unspecified maternal hypertension, third trimester: Secondary | ICD-10-CM | POA: Diagnosis present

## 2022-05-21 HISTORY — DX: Other specified health status: Z78.9

## 2022-05-21 LAB — CBC WITH DIFFERENTIAL/PLATELET
Abs Immature Granulocytes: 0.15 10*3/uL — ABNORMAL HIGH (ref 0.00–0.07)
Basophils Absolute: 0 10*3/uL (ref 0.0–0.1)
Basophils Relative: 0 %
Eosinophils Absolute: 0 10*3/uL (ref 0.0–0.5)
Eosinophils Relative: 0 %
HCT: 34 % — ABNORMAL LOW (ref 36.0–46.0)
Hemoglobin: 11.5 g/dL — ABNORMAL LOW (ref 12.0–15.0)
Immature Granulocytes: 1 %
Lymphocytes Relative: 17 %
Lymphs Abs: 2.1 10*3/uL (ref 0.7–4.0)
MCH: 28.5 pg (ref 26.0–34.0)
MCHC: 33.8 g/dL (ref 30.0–36.0)
MCV: 84.2 fL (ref 80.0–100.0)
Monocytes Absolute: 0.8 10*3/uL (ref 0.1–1.0)
Monocytes Relative: 7 %
Neutro Abs: 8.8 10*3/uL — ABNORMAL HIGH (ref 1.7–7.7)
Neutrophils Relative %: 75 %
Platelets: 236 10*3/uL (ref 150–400)
RBC: 4.04 MIL/uL (ref 3.87–5.11)
RDW: 13.4 % (ref 11.5–15.5)
WBC: 11.9 10*3/uL — ABNORMAL HIGH (ref 4.0–10.5)
nRBC: 0 % (ref 0.0–0.2)

## 2022-05-21 LAB — COMPREHENSIVE METABOLIC PANEL
ALT: 11 U/L (ref 0–44)
AST: 23 U/L (ref 15–41)
Albumin: 3 g/dL — ABNORMAL LOW (ref 3.5–5.0)
Alkaline Phosphatase: 89 U/L (ref 38–126)
Anion gap: 7 (ref 5–15)
BUN: 10 mg/dL (ref 6–20)
CO2: 19 mmol/L — ABNORMAL LOW (ref 22–32)
Calcium: 8.7 mg/dL — ABNORMAL LOW (ref 8.9–10.3)
Chloride: 112 mmol/L — ABNORMAL HIGH (ref 98–111)
Creatinine, Ser: 0.66 mg/dL (ref 0.44–1.00)
GFR, Estimated: >60 mL/min (ref >60)
Glucose, Bld: 95 mg/dL (ref 70–99)
Potassium: 3.5 mmol/L (ref 3.5–5.1)
Sodium: 138 mmol/L (ref 135–145)
Total Bilirubin: 0.4 mg/dL (ref 0.3–1.2)
Total Protein: 6.2 g/dL — ABNORMAL LOW (ref 6.5–8.1)

## 2022-05-21 LAB — PROTEIN / CREATININE RATIO, URINE
Creatinine, Urine: 103 mg/dL
Protein Creatinine Ratio: 0.09 mg/mg{Cre} (ref 0.00–0.15)
Total Protein, Urine: 9 mg/dL

## 2022-05-21 NOTE — Progress Notes (Signed)
Lauren Duran is a 31 y.o. female. She is at [redacted]w[redacted]d gestation. No LMP recorded. Patient is pregnant. 06/19/2022, by Patient Reported   Prenatal care site: [facility]  Chief complaint: elevated blood pressure at home  HPI: Lauren Duran presents to L&D with complaints of elevated blood pressure at home.  Reports BP's were 130-140's/80's at home.  Came down to 130/80's with rest.  Reports a slight headache this morning that got better after eating.  Denies changes in vision or RUQ pain.    Factors complicating pregnancy: Elevated BP in pregnancy Uterine S>D Elevated BP without diagnosis of HTN  S: Resting comfortably. no CTX, no VB.no LOF,  Active fetal movement.   Maternal Medical History:  Past Medical Hx:  has a past medical history of Medical history non-contributory.    Past Surgical Hx:  has a past surgical history that includes Tonsillectomy; Induced abortion; and foot (Right).   Allergies  Allergen Reactions   Red Dye Hives     Prior to Admission medications   Medication Sig Start Date End Date Taking? Authorizing Provider  aspirin 81 MG chewable tablet Chew 81 mg by mouth daily.   Yes [provider]  doxylamine, Sleep, (UNISOM) 25 MG tablet Take 25 mg by mouth at bedtime as needed for sleep.   Yes [provider]  omeprazole (PRILOSEC) 10 MG capsule Take 10 mg by mouth daily.   Yes [provider]  Prenatal Vit-Fe Fumarate-FA (PRENATAL MULTIVITAMIN) TABS tablet Take 1 tablet by mouth daily at 12 noon.   Yes [provider]  doxycycline (VIBRA-TABS) 100 MG tablet Take 1 tablet (100 mg total) by mouth 2 (two) times daily. Patient not taking: Reported on 05/21/2022 06/08/21   Felecia Shelling, DPM  etonogestrel-ethinyl estradiol (NUVARING) 0.12-0.015 MG/24HR vaginal ring INSERT 1 RING VAGINALLY AS DIRECTED. REMOVE AFTER 3 WEEKS & WAIT 7 DAYS BEFORE INSERTING A NEW RING Patient not taking: Reported on 05/21/2022 08/01/20   Nadara Mustard, MD  gentamicin cream (GARAMYCIN) 0.1 % Apply 1 application topically 3 (three) times daily. Patient not taking: Reported on 05/21/2022 06/12/21   Felecia Shelling, DPM  ibuprofen (ADVIL) 800 MG tablet TAKE 1 TABLET BY MOUTH THREE TIMES A DAY Patient not taking: Reported on 05/21/2022 09/03/21   Felecia Shelling, DPM  levocetirizine (XYZAL) 5 MG tablet Take 5 mg by mouth every evening. Patient not taking: Reported on 05/21/2022    [provider]  oxyCODONE-acetaminophen (PERCOCET) 5-325 MG tablet Take 1 tablet by mouth every 4 (four) hours as needed for severe pain. Patient not taking: Reported on 05/21/2022 05/29/21   Felecia Shelling, DPM  promethazine (PHENERGAN) 25 MG tablet Take 1 tablet (25 mg total) by mouth every 8 (eight) hours as needed for nausea or vomiting. Patient not taking: Reported on 05/21/2022 05/21/21   Felecia Shelling, DPM    Social History: She  reports that she has never smoked. She has never used smokeless tobacco. She reports current alcohol use. She reports that she does not use drugs.  Family History: family history includes Heart disease in her father and maternal grandfather; Hypertension in her father.  Review of Systems: A full review of systems was performed and negative except as noted in the HPI.     Pertinent Results:  Prenatal Labs: Blood type/Rh A pos  Antibody screen Negative    Rubella Non-Immune    Varicella Immune  RPR NR    HBsAg NR   Hep C NR  HIV NR    GC neg  Chlamydia neg  Genetic screening Declined   1 hour GTT N/A  3 hour GTT 77, 103, 103, 83  GBS Unknown       O:  BP 122/82   Pulse 94   Temp 98.1 F (36.7 C) (Oral)   Resp 18   Ht 5\' 5"  (1.651 m)   Wt 106.6 kg   BMI 39.11 kg/m  Results for orders placed or performed during the hospital encounter of 05/21/22 (from the past 48 hour(s))  Protein / creatinine ratio, urine   Collection Time: 05/21/22  9:54 AM  Result Value Ref Range   Creatinine, Urine 103 mg/dL    Total Protein, Urine 9 mg/dL   Protein Creatinine Ratio 0.09 0.00 - 0.15 mg/mg[Cre]  Comprehensive metabolic panel   Collection Time: 05/21/22 10:10 AM  Result Value Ref Range   Sodium 138 135 - 145 mmol/L   Potassium 3.5 3.5 - 5.1 mmol/L   Chloride 112 (H) 98 - 111 mmol/L   CO2 19 (L) 22 - 32 mmol/L   Glucose, Bld 95 70 - 99 mg/dL   BUN 10 6 - 20 mg/dL   Creatinine, Ser 05/23/22 0.44 - 1.00 mg/dL   Calcium 8.7 (L) 8.9 - 10.3 mg/dL   Total Protein 6.2 (L) 6.5 - 8.1 g/dL   Albumin 3.0 (L) 3.5 - 5.0 g/dL   AST 23 15 - 41 U/L   ALT 11 0 - 44 U/L   Alkaline Phosphatase 89 38 - 126 U/L   Total Bilirubin 0.4 0.3 - 1.2 mg/dL   GFR, Estimated 2.84 >13 mL/min   Anion gap 7 5 - 15  CBC with Differential/Platelet   Collection Time: 05/21/22 10:10 AM  Result Value Ref Range   WBC 11.9 (H) 4.0 - 10.5 K/uL   RBC 4.04 3.87 - 5.11 MIL/uL   Hemoglobin 11.5 (L) 12.0 - 15.0 g/dL   HCT 05/23/22 (L) 40.1 - 02.7 %   MCV 84.2 80.0 - 100.0 fL   MCH 28.5 26.0 - 34.0 pg   MCHC 33.8 30.0 - 36.0 g/dL   RDW 25.3 66.4 - 40.3 %   Platelets 236 150 - 400 K/uL   nRBC 0.0 0.0 - 0.2 %   Neutrophils Relative % 75 %   Neutro Abs 8.8 (H) 1.7 - 7.7 K/uL   Lymphocytes Relative 17 %   Lymphs Abs 2.1 0.7 - 4.0 K/uL   Monocytes Relative 7 %   Monocytes Absolute 0.8 0.1 - 1.0 K/uL   Eosinophils Relative 0 %   Eosinophils Absolute 0.0 0.0 - 0.5 K/uL   Basophils Relative 0 %   Basophils Absolute 0.0 0.0 - 0.1 K/uL   Immature Granulocytes 1 %   Abs Immature Granulocytes 0.15 (H) 0.00 - 0.07 K/uL     Constitutional: NAD, AAOx3  HE/ENT: extraocular movements grossly intact, moist mucous membranes CV: RRR PULM: nl respiratory effort Abd: gravid, non-tender, non-distended, soft  Ext: Non-tender, Nonedmeatous Psych: mood appropriate, speech normal Pelvic : deferred  NST: Baseline FHR: 145 beats/min Variability: moderate Accelerations: present Decelerations: absent Tocometry: None  Interpretation: Category  I INDICATIONS: Elevated blood pressure in pregnancy RESULTS:  A NST procedure was performed with FHR monitoring and a normal baseline established, appropriate time of 20-40 minutes of evaluation, and accels >2 seen w 15x15 characteristics.  Results show a REACTIVE NST.   Assessment: 31 y.o. G2P0010 [redacted]w[redacted]d 06/19/2022, by Patient Reported   Principle diagnosis: Elevated blood pressure affecting pregnancy  in third trimester, antepartum [O16.3]   Plan: 1) Reactive NST  -Category 1 tracing  -Reassuring fetal status   2) Elevated blood pressure  -BP's all WNL - no elevations -PreE labs WNL -Continue to check BP at home -Discussed warning signs to return to L&D triage with   3) Disposition: discharge home stable -Precautions reviewed  -Follow up this Friday for BP check at [facility]    ----- Margaretmary Eddy, CNM Certified Nurse Midwife [place]  [facility]

## 2022-05-21 NOTE — Discharge Summary (Signed)
Lauren Duran is a 31 y.o. female. She is at [redacted]w[redacted]d gestation. No LMP recorded. Patient is pregnant. 06/19/2022, by Patient Reported   Prenatal care site: [facility]  Chief complaint: elevated blood pressure at home  HPI: Lauren Duran presents to L&D with complaints of elevated blood pressure at home.  Reports BP's were 130-140's/80's at home.  Came down to 130/80's with rest.  Reports a slight headache this morning that got better after eating.  Denies changes in vision or RUQ pain.    Factors complicating pregnancy: Elevated BP in pregnancy Uterine S>D Elevated BP without diagnosis of HTN  S: Resting comfortably. no CTX, no VB.no LOF,  Active fetal movement.   Maternal Medical History:  Past Medical Hx:  has a past medical history of Medical history non-contributory.    Past Surgical Hx:  has a past surgical history that includes Tonsillectomy; Induced abortion; and foot (Right).   Allergies  Allergen Reactions   Red Dye Hives     Prior to Admission medications   Medication Sig Start Date End Date Taking? Authorizing Provider  aspirin 81 MG chewable tablet Chew 81 mg by mouth daily.   Yes [provider]  doxylamine, Sleep, (UNISOM) 25 MG tablet Take 25 mg by mouth at bedtime as needed for sleep.   Yes [provider]  omeprazole (PRILOSEC) 10 MG capsule Take 10 mg by mouth daily.   Yes [provider]  Prenatal Vit-Fe Fumarate-FA (PRENATAL MULTIVITAMIN) TABS tablet Take 1 tablet by mouth daily at 12 noon.   Yes [provider]  doxycycline (VIBRA-TABS) 100 MG tablet Take 1 tablet (100 mg total) by mouth 2 (two) times daily. Patient not taking: Reported on 05/21/2022 06/08/21   Felecia Shelling, DPM  etonogestrel-ethinyl estradiol (NUVARING) 0.12-0.015 MG/24HR vaginal ring INSERT 1 RING VAGINALLY AS DIRECTED. REMOVE AFTER 3 WEEKS & WAIT 7 DAYS BEFORE INSERTING A NEW RING Patient not taking: Reported on 05/21/2022 08/01/20   Nadara Mustard, MD  gentamicin cream (GARAMYCIN) 0.1 % Apply 1 application topically 3 (three) times daily. Patient not taking: Reported on 05/21/2022 06/12/21   Felecia Shelling, DPM  ibuprofen (ADVIL) 800 MG tablet TAKE 1 TABLET BY MOUTH THREE TIMES A DAY Patient not taking: Reported on 05/21/2022 09/03/21   Felecia Shelling, DPM  levocetirizine (XYZAL) 5 MG tablet Take 5 mg by mouth every evening. Patient not taking: Reported on 05/21/2022    [provider]  oxyCODONE-acetaminophen (PERCOCET) 5-325 MG tablet Take 1 tablet by mouth every 4 (four) hours as needed for severe pain. Patient not taking: Reported on 05/21/2022 05/29/21   Felecia Shelling, DPM  promethazine (PHENERGAN) 25 MG tablet Take 1 tablet (25 mg total) by mouth every 8 (eight) hours as needed for nausea or vomiting. Patient not taking: Reported on 05/21/2022 05/21/21   Felecia Shelling, DPM    Social History: She  reports that she has never smoked. She has never used smokeless tobacco. She reports current alcohol use. She reports that she does not use drugs.  Family History: family history includes Heart disease in her father and maternal grandfather; Hypertension in her father.  Review of Systems: A full review of systems was performed and negative except as noted in the HPI.     Pertinent Results:  Prenatal Labs: Blood type/Rh A pos  Antibody screen Negative    Rubella Non-Immune    Varicella Immune  RPR NR    HBsAg NR   Hep C NR  HIV NR    GC neg  Chlamydia neg  Genetic screening Declined   1 hour GTT N/A  3 hour GTT 77, 103, 103, 83  GBS Unknown       O:  BP 117/67   Pulse 84   Temp 98.1 F (36.7 C) (Oral)   Resp 18   Ht 5\' 5"  (1.651 m)   Wt 106.6 kg   BMI 39.11 kg/m  Results for orders placed or performed during the hospital encounter of 05/21/22 (from the past 48 hour(s))  Protein / creatinine ratio, urine   Collection Time: 05/21/22  9:54 AM  Result Value Ref Range   Creatinine, Urine 103 mg/dL    Total Protein, Urine 9 mg/dL   Protein Creatinine Ratio 0.09 0.00 - 0.15 mg/mg[Cre]  Comprehensive metabolic panel   Collection Time: 05/21/22 10:10 AM  Result Value Ref Range   Sodium 138 135 - 145 mmol/L   Potassium 3.5 3.5 - 5.1 mmol/L   Chloride 112 (H) 98 - 111 mmol/L   CO2 19 (L) 22 - 32 mmol/L   Glucose, Bld 95 70 - 99 mg/dL   BUN 10 6 - 20 mg/dL   Creatinine, Ser 05/23/22 0.44 - 1.00 mg/dL   Calcium 8.7 (L) 8.9 - 10.3 mg/dL   Total Protein 6.2 (L) 6.5 - 8.1 g/dL   Albumin 3.0 (L) 3.5 - 5.0 g/dL   AST 23 15 - 41 U/L   ALT 11 0 - 44 U/L   Alkaline Phosphatase 89 38 - 126 U/L   Total Bilirubin 0.4 0.3 - 1.2 mg/dL   GFR, Estimated 2.29 >79 mL/min   Anion gap 7 5 - 15  CBC with Differential/Platelet   Collection Time: 05/21/22 10:10 AM  Result Value Ref Range   WBC 11.9 (H) 4.0 - 10.5 K/uL   RBC 4.04 3.87 - 5.11 MIL/uL   Hemoglobin 11.5 (L) 12.0 - 15.0 g/dL   HCT 05/23/22 (L) 21.1 - 94.1 %   MCV 84.2 80.0 - 100.0 fL   MCH 28.5 26.0 - 34.0 pg   MCHC 33.8 30.0 - 36.0 g/dL   RDW 74.0 81.4 - 48.1 %   Platelets 236 150 - 400 K/uL   nRBC 0.0 0.0 - 0.2 %   Neutrophils Relative % 75 %   Neutro Abs 8.8 (H) 1.7 - 7.7 K/uL   Lymphocytes Relative 17 %   Lymphs Abs 2.1 0.7 - 4.0 K/uL   Monocytes Relative 7 %   Monocytes Absolute 0.8 0.1 - 1.0 K/uL   Eosinophils Relative 0 %   Eosinophils Absolute 0.0 0.0 - 0.5 K/uL   Basophils Relative 0 %   Basophils Absolute 0.0 0.0 - 0.1 K/uL   Immature Granulocytes 1 %   Abs Immature Granulocytes 0.15 (H) 0.00 - 0.07 K/uL     Constitutional: NAD, AAOx3  HE/ENT: extraocular movements grossly intact, moist mucous membranes CV: RRR PULM: nl respiratory effort Abd: gravid, non-tender, non-distended, soft  Ext: Non-tender, Nonedmeatous Psych: mood appropriate, speech normal Pelvic : deferred  NST: Baseline FHR: 145 beats/min Variability: moderate Accelerations: present Decelerations: absent Tocometry: None  Interpretation: Category  I INDICATIONS: Elevated blood pressure in pregnancy RESULTS:  A NST procedure was performed with FHR monitoring and a normal baseline established, appropriate time of 20-40 minutes of evaluation, and accels >2 seen w 15x15 characteristics.  Results show a REACTIVE NST.   Assessment: 31 y.o. G2P0010 [redacted]w[redacted]d 06/19/2022, by Patient Reported   Principle diagnosis: Elevated blood pressure affecting pregnancy  in third trimester, antepartum [O16.3]   Plan: 1) Reactive NST  -Category 1 tracing  -Reassuring fetal status   2) Elevated blood pressure  -BP's all WNL - no elevations -PreE labs WNL -Continue to check BP at home -Discussed warning signs to return to L&D triage with   3) Disposition: discharge home stable -Precautions reviewed  -Follow up this Friday for BP check at [facility]    ----- Margaretmary Eddy, CNM Certified Nurse Midwife [place]  [facility]

## 2022-05-21 NOTE — OB Triage Note (Signed)
Pt discharged home per order.   Pt stable and ambulatory and an After Visit Summary was printed and given to the patient. Discharge education completed with patient/family including follow up instructions, appointments, and medication list. Pt received PIH and labor precautions. Patient able to verbalize understanding, all questions fully answered upon discharge. Patient instructed to return to ED, call 911, or call MD for any changes in condition. Pt discharged home via personal vehicle with support person.  Pt to follow up Friday with office.

## 2022-05-21 NOTE — Progress Notes (Signed)
Pt presents to L/D triage for PIH eval after reported home Bps in the 130s-140s systolic. Pt reports a slight headache this morning-relieved by food. Pt reports no blurry vision or epigastric pain. +1 edema noted on lower extremities. +2 reflexes, no clonus. Pt reports no fetal concerns- no bleeding, LOF, or CTX and positive fetal movement.  Monitors applied and assessing NST. Initial BP 132/88-cycling q15 min.

## 2022-05-24 ENCOUNTER — Other Ambulatory Visit: Payer: Self-pay

## 2022-05-24 DIAGNOSIS — Z2839 Other underimmunization status: Secondary | ICD-10-CM

## 2022-05-24 DIAGNOSIS — O99213 Obesity complicating pregnancy, third trimester: Secondary | ICD-10-CM

## 2022-05-24 DIAGNOSIS — O09899 Supervision of other high risk pregnancies, unspecified trimester: Secondary | ICD-10-CM | POA: Insufficient documentation

## 2022-05-24 DIAGNOSIS — O133 Gestational [pregnancy-induced] hypertension without significant proteinuria, third trimester: Secondary | ICD-10-CM

## 2022-05-24 DIAGNOSIS — O26849 Uterine size-date discrepancy, unspecified trimester: Secondary | ICD-10-CM

## 2022-05-24 NOTE — Progress Notes (Signed)
G2P0010 at [redacted]w[redacted]d, Patient's last menstrual period was 09/12/2021 (exact date)., c/w early [place] at [redacted]w[redacted]d.  Scheduled for induction of labor for gestational hypertension on 05/29/2022 at 0001.   Prenatal provider: [facility] Pregnancy complicated by: 1. Gestational hypertension, third trimester   2. Obesity affecting pregnancy in third trimester, unspecified obesity type   3. Uterine size date discrepancy pregnancy   4. Rubella non-immune status, antepartum     Prenatal Labs: Blood type/Rh A pos  Antibody screen neg  Rubella Non-Immune    Varicella Immune  RPR NR  HBsAg NR  Hep C NR  HIV NR  GC neg  Chlamydia neg  Genetic screening cfDNA negative  1 hour GTT N/A  3 hour GTT 77, 103, 103, 83  GBS Pending    Tdap: Given 03/29/22 Flu: declined  Contraception: IUD Mirena Feeding preference: breast feeding  ____ Margaretmary Eddy, CNM Certified Nurse Midwife [facility]

## 2022-05-29 ENCOUNTER — Inpatient Hospital Stay: Payer: 59 | Admitting: Anesthesiology

## 2022-05-29 ENCOUNTER — Other Ambulatory Visit: Payer: Self-pay

## 2022-05-29 ENCOUNTER — Encounter: Payer: Self-pay | Admitting: Obstetrics and Gynecology

## 2022-05-29 ENCOUNTER — Inpatient Hospital Stay
Admission: EM | Admit: 2022-05-29 | Discharge: 2022-05-31 | DRG: 806 | Disposition: A | Discharge status: Home / Self Care | Payer: 59 | Attending: Obstetrics and Gynecology | Admitting: Obstetrics and Gynecology

## 2022-05-29 DIAGNOSIS — O133 Gestational [pregnancy-induced] hypertension without significant proteinuria, third trimester: Secondary | ICD-10-CM

## 2022-05-29 DIAGNOSIS — O134 Gestational [pregnancy-induced] hypertension without significant proteinuria, complicating childbirth: Secondary | ICD-10-CM | POA: Diagnosis present

## 2022-05-29 DIAGNOSIS — D62 Acute posthemorrhagic anemia: Secondary | ICD-10-CM | POA: Diagnosis not present

## 2022-05-29 DIAGNOSIS — Z3A37 37 weeks gestation of pregnancy: Secondary | ICD-10-CM | POA: Diagnosis not present

## 2022-05-29 DIAGNOSIS — O9081 Anemia of the puerperium: Secondary | ICD-10-CM | POA: Diagnosis not present

## 2022-05-29 DIAGNOSIS — O99214 Obesity complicating childbirth: Secondary | ICD-10-CM | POA: Diagnosis present

## 2022-05-29 DIAGNOSIS — O139 Gestational [pregnancy-induced] hypertension without significant proteinuria, unspecified trimester: Secondary | ICD-10-CM | POA: Diagnosis present

## 2022-05-29 DIAGNOSIS — Z7982 Long term (current) use of aspirin: Secondary | ICD-10-CM | POA: Diagnosis not present

## 2022-05-29 LAB — COMPREHENSIVE METABOLIC PANEL
ALT: 12 U/L (ref 0–44)
AST: 21 U/L (ref 15–41)
Albumin: 3.3 g/dL — ABNORMAL LOW (ref 3.5–5.0)
Alkaline Phosphatase: 104 U/L (ref 38–126)
Anion gap: 5 (ref 5–15)
BUN: 11 mg/dL (ref 6–20)
CO2: 20 mmol/L — ABNORMAL LOW (ref 22–32)
Calcium: 9.5 mg/dL (ref 8.9–10.3)
Chloride: 111 mmol/L (ref 98–111)
Creatinine, Ser: 0.65 mg/dL (ref 0.44–1.00)
GFR, Estimated: >60 mL/min (ref >60)
Glucose, Bld: 81 mg/dL (ref 70–99)
Potassium: 3.9 mmol/L (ref 3.5–5.1)
Sodium: 136 mmol/L (ref 135–145)
Total Bilirubin: 0.4 mg/dL (ref 0.3–1.2)
Total Protein: 7 g/dL (ref 6.5–8.1)

## 2022-05-29 LAB — CBC
HCT: 36 % (ref 36.0–46.0)
Hemoglobin: 12 g/dL (ref 12.0–15.0)
MCH: 28.2 pg (ref 26.0–34.0)
MCHC: 33.3 g/dL (ref 30.0–36.0)
MCV: 84.7 fL (ref 80.0–100.0)
Platelets: 262 10*3/uL (ref 150–400)
RBC: 4.25 MIL/uL (ref 3.87–5.11)
RDW: 13.7 % (ref 11.5–15.5)
WBC: 14.6 10*3/uL — ABNORMAL HIGH (ref 4.0–10.5)
nRBC: 0 % (ref 0.0–0.2)

## 2022-05-29 LAB — PROTEIN / CREATININE RATIO, URINE
Creatinine, Urine: 72 mg/dL
Protein Creatinine Ratio: 0.08 mg/mg{Cre} (ref 0.00–0.15)
Total Protein, Urine: 6 mg/dL

## 2022-05-29 LAB — ABO/RH: ABO/RH(D): A POS

## 2022-05-29 LAB — TYPE AND SCREEN
ABO/RH(D): A POS
Antibody Screen: NEGATIVE

## 2022-05-29 LAB — RPR: RPR Ser Ql: NONREACTIVE

## 2022-05-29 MED ORDER — AMMONIA AROMATIC IN INHA
RESPIRATORY_TRACT | Status: AC
  Filled 2022-05-29: qty 10

## 2022-05-29 MED ORDER — OXYTOCIN BOLUS FROM INFUSION: 333.0000 mL | Freq: Once | INTRAVENOUS | Status: DC

## 2022-05-29 MED ORDER — MISOPROSTOL 25 MCG QUARTER TABLET
25.0000 ug | ORAL_TABLET | ORAL | Status: AC
  Administered 2022-05-29: 25 ug via ORAL
  Filled 2022-05-29: qty 1

## 2022-05-29 MED ORDER — EPHEDRINE 5 MG/ML INJ: 10.0000 mg | INTRAVENOUS | Status: DC | PRN

## 2022-05-29 MED ORDER — LIDOCAINE HCL (PF) 1 % IJ SOLN
INTRAMUSCULAR | Status: DC | PRN
  Administered 2022-05-29: 3 mL

## 2022-05-29 MED ORDER — LIDOCAINE-EPINEPHRINE (PF) 1.5 %-1:200000 IJ SOLN
INTRAMUSCULAR | Status: DC | PRN
  Administered 2022-05-29: 3 mL via PERINEURAL
  Administered 2022-05-29: 2 mL via PERINEURAL

## 2022-05-29 MED ORDER — OXYTOCIN-SODIUM CHLORIDE 30-0.9 UT/500ML-% IV SOLN
1.0000 m[IU]/min | INTRAVENOUS | Status: DC
  Administered 2022-05-29: 2 m[IU]/min via INTRAVENOUS
  Filled 2022-05-29: qty 1000

## 2022-05-29 MED ORDER — FENTANYL-BUPIVACAINE-NACL 0.5-0.125-0.9 MG/250ML-% EP SOLN
EPIDURAL | Status: DC | PRN
  Administered 2022-05-29: 10 mL/h via EPIDURAL

## 2022-05-29 MED ORDER — FENTANYL-BUPIVACAINE-NACL 0.5-0.125-0.9 MG/250ML-% EP SOLN
EPIDURAL | Status: AC
  Filled 2022-05-29: qty 250

## 2022-05-29 MED ORDER — OXYTOCIN-SODIUM CHLORIDE 30-0.9 UT/500ML-% IV SOLN
2.5000 [IU]/h | INTRAVENOUS | Status: DC
  Filled 2022-05-29: qty 500

## 2022-05-29 MED ORDER — SODIUM CHLORIDE 0.9 % IV SOLN: 250.0000 mL | INTRAVENOUS | Status: DC | PRN

## 2022-05-29 MED ORDER — SODIUM CHLORIDE 0.9% FLUSH: 3.0000 mL | INTRAVENOUS | Status: DC | PRN

## 2022-05-29 MED ORDER — LACTATED RINGERS IV SOLN
INTRAVENOUS | Status: DC
  Administered 2022-05-29: 125 mL/h via INTRAVENOUS

## 2022-05-29 MED ORDER — FENTANYL-BUPIVACAINE-NACL 0.5-0.125-0.9 MG/250ML-% EP SOLN: 12.0000 mL/h | EPIDURAL | Status: DC | PRN

## 2022-05-29 MED ORDER — MISOPROSTOL 200 MCG PO TABS
ORAL_TABLET | ORAL | Status: AC
  Filled 2022-05-29: qty 4

## 2022-05-29 MED ORDER — LIDOCAINE HCL (PF) 1 % IJ SOLN
INTRAMUSCULAR | Status: AC
  Filled 2022-05-29: qty 30

## 2022-05-29 MED ORDER — SOD CITRATE-CITRIC ACID 500-334 MG/5ML PO SOLN: 30.0000 mL | ORAL | Status: DC | PRN

## 2022-05-29 MED ORDER — MISOPROSTOL 25 MCG QUARTER TABLET
ORAL_TABLET | ORAL | Status: AC
  Filled 2022-05-29: qty 2

## 2022-05-29 MED ORDER — PHENYLEPHRINE 80 MCG/ML (10ML) SYRINGE FOR IV PUSH (FOR BLOOD PRESSURE SUPPORT): 80.0000 ug | PREFILLED_SYRINGE | INTRAVENOUS | Status: DC | PRN

## 2022-05-29 MED ORDER — FENTANYL CITRATE (PF) 100 MCG/2ML IJ SOLN: 50.0000 ug | INTRAMUSCULAR | Status: DC | PRN

## 2022-05-29 MED ORDER — ACETAMINOPHEN 500 MG PO TABS: 1000.0000 mg | ORAL_TABLET | Freq: Four times a day (QID) | ORAL | Status: DC | PRN

## 2022-05-29 MED ORDER — DIPHENHYDRAMINE HCL 50 MG/ML IJ SOLN: 12.5000 mg | INTRAMUSCULAR | Status: DC | PRN

## 2022-05-29 MED ORDER — LACTATED RINGERS IV SOLN
500.0000 mL | Freq: Once | INTRAVENOUS | Status: AC
  Administered 2022-05-29: 500 mL via INTRAVENOUS

## 2022-05-29 MED ORDER — LIDOCAINE HCL (PF) 1 % IJ SOLN: 30.0000 mL | INTRAMUSCULAR | Status: DC | PRN

## 2022-05-29 MED ORDER — MISOPROSTOL 25 MCG QUARTER TABLET
25.0000 ug | ORAL_TABLET | ORAL | Status: AC
  Administered 2022-05-29 (×2): 25 ug via VAGINAL

## 2022-05-29 MED ORDER — TERBUTALINE SULFATE 1 MG/ML IJ SOLN: 0.2500 mg | Freq: Once | INTRAMUSCULAR | Status: DC | PRN

## 2022-05-29 MED ORDER — SODIUM CHLORIDE 0.9% FLUSH: 3.0000 mL | Freq: Two times a day (BID) | INTRAVENOUS | Status: DC

## 2022-05-29 MED ORDER — BUPIVACAINE HCL (PF) 0.25 % IJ SOLN
INTRAMUSCULAR | Status: DC | PRN
  Administered 2022-05-29 (×2): 2 mL via EPIDURAL

## 2022-05-29 MED ORDER — LACTATED RINGERS IV SOLN
500.0000 mL | INTRAVENOUS | Status: DC | PRN
  Administered 2022-05-30 (×2): 500 mL via INTRAVENOUS

## 2022-05-29 MED ORDER — ONDANSETRON HCL 4 MG/2ML IJ SOLN
4.0000 mg | Freq: Four times a day (QID) | INTRAMUSCULAR | Status: DC | PRN
  Administered 2022-05-29 – 2022-05-30 (×2): 4 mg via INTRAVENOUS
  Filled 2022-05-29 (×2): qty 2

## 2022-05-29 MED ORDER — OXYTOCIN 10 UNIT/ML IJ SOLN
INTRAMUSCULAR | Status: AC
  Filled 2022-05-29: qty 2

## 2022-05-29 MED ORDER — MISOPROSTOL 25 MCG QUARTER TABLET
25.0000 ug | ORAL_TABLET | ORAL | Status: AC
  Administered 2022-05-29 (×2): 25 ug via ORAL
  Filled 2022-05-29 (×2): qty 1

## 2022-05-29 MED ORDER — MISOPROSTOL 25 MCG QUARTER TABLET
25.0000 ug | ORAL_TABLET | ORAL | Status: AC
  Administered 2022-05-29 (×2): 25 ug via VAGINAL
  Filled 2022-05-29 (×2): qty 1

## 2022-05-29 NOTE — H&P (Signed)
OB History & Physical   History of Present Illness:  Chief Complaint: Induction for high blood pressure  HPI:  Shwana Braggs is a 31 y.o. G2P0010 female at [redacted]w[redacted]d dated by [place] at [redacted]w[redacted]d; EDD 06/19/22.  She presents to L&D for scheduled IOL due to [facility]; elevated BP in office.     Pregnancy Issues: 1. Gestational hypertension, third trimester   2. Obesity affecting pregnancy in third trimester, unspecified obesity type   3. Uterine size date discrepancy pregnancy- growth [place] 10/26 WNL  4. Rubella non-immune status, antepartum      Maternal Medical History:   Past Medical History:  Diagnosis Date   Medical history non-contributory     Past Surgical History:  Procedure Laterality Date   foot Right    INDUCED ABORTION     TONSILLECTOMY      Allergies  Allergen Reactions   Red Dye Hives    Prior to Admission medications   Medication Sig Start Date End Date Taking? Authorizing Provider  aspirin 81 MG chewable tablet Chew 81 mg by mouth daily.    [provider]  doxylamine, Sleep, (UNISOM) 25 MG tablet Take 25 mg by mouth at bedtime as needed for sleep.    [provider]  omeprazole (PRILOSEC) 10 MG capsule Take 10 mg by mouth daily.    [provider]  Prenatal Vit-Fe Fumarate-FA (PRENATAL MULTIVITAMIN) TABS tablet Take 1 tablet by mouth daily at 12 noon.    [provider]     Prenatal care site: [facility]  Social History: She  reports that she has never smoked. She has never used smokeless tobacco. She reports current alcohol use. She reports that she does not use drugs.  Family History: family history includes Heart disease in her father and maternal grandfather; Hypertension in her father.   Review of Systems: A full review of systems was performed and negative except as noted in the HPI.     Physical Exam:  Vital Signs: BP 134/80   Pulse 71   Temp 98.4 F (36.9 C) (Oral)   Resp 16   LMP 09/12/2021 (Exact Date)    Vitals:   05/29/22 0305 05/29/22 0325 05/29/22 0345 05/29/22 0405  BP: (!) 119/59 114/60 121/76 114/67   05/29/22 0424 05/29/22 0445 05/29/22 0504 05/29/22 0702  BP: 124/83 124/74 121/86 127/67   05/29/22 0929 05/29/22 1127 05/29/22 1333 05/29/22 1545  BP: (!) 137/97 115/67 129/77 134/80     General: no acute distress.  HEENT: normocephalic, atraumatic Heart: regular rate & rhythm.  No murmurs/rubs/gallops Lungs: clear to auscultation bilaterally, normal respiratory effort Abdomen: soft, gravid, non-tender;  EFW: 8lbs Pelvic:   External: Normal external female genitalia  Cervix: Dilation: 3 / Effacement (%): 50 / Station: -1    Extremities: non-tender, symmetric, 2+ edema bilaterally.  DTRs: 2+  Neurologic: Alert & oriented x 3.    Results for orders placed or performed during the hospital encounter of 05/29/22 (from the past 24 hour(s))  CBC     Status: Abnormal   Collection Time: 05/29/22 12:39 AM  Result Value Ref Range   WBC 14.6 (H) 4.0 - 10.5 K/uL   RBC 4.25 3.87 - 5.11 MIL/uL   Hemoglobin 12.0 12.0 - 15.0 g/dL   HCT 49.2 01.0 - 07.1 %   MCV 84.7 80.0 - 100.0 fL   MCH 28.2 26.0 - 34.0 pg   MCHC 33.3 30.0 - 36.0 g/dL   RDW 21.9 75.8 - 83.2 %  Platelets 262 150 - 400 K/uL   nRBC 0.0 0.0 - 0.2 %  Type and screen     Status: None   Collection Time: 05/29/22 12:39 AM  Result Value Ref Range   ABO/RH(D) A POS    Antibody Screen NEG    Sample Expiration      06/01/2022,2359 Performed at [facility], [address] [place], [place] [address]   RPR     Status: None   Collection Time: 05/29/22 12:39 AM  Result Value Ref Range   RPR Ser Ql NON REACTIVE NON REACTIVE  Protein / creatinine ratio, urine     Status: None   Collection Time: 05/29/22 12:39 AM  Result Value Ref Range   Creatinine, Urine 72 mg/dL   Total Protein, Urine 6 mg/dL   Protein Creatinine Ratio 0.08 0.00 - 0.15 mg/mg[Cre]  Comprehensive metabolic panel     Status: Abnormal    Collection Time: 05/29/22 12:39 AM  Result Value Ref Range   Sodium 136 135 - 145 mmol/L   Potassium 3.9 3.5 - 5.1 mmol/L   Chloride 111 98 - 111 mmol/L   CO2 20 (L) 22 - 32 mmol/L   Glucose, Bld 81 70 - 99 mg/dL   BUN 11 6 - 20 mg/dL   Creatinine, Ser 1.74 0.44 - 1.00 mg/dL   Calcium 9.5 8.9 - 08.1 mg/dL   Total Protein 7.0 6.5 - 8.1 g/dL   Albumin 3.3 (L) 3.5 - 5.0 g/dL   AST 21 15 - 41 U/L   ALT 12 0 - 44 U/L   Alkaline Phosphatase 104 38 - 126 U/L   Total Bilirubin 0.4 0.3 - 1.2 mg/dL   GFR, Estimated >44 >81 mL/min   Anion gap 5 5 - 15  ABO/Rh     Status: None   Collection Time: 05/29/22  2:06 AM  Result Value Ref Range   ABO/RH(D)      A POS Performed at [facility], [address], [place], [place] [address]     Pertinent Results:  Prenatal Labs: Blood type/Rh A Pos  Antibody screen neg  Rubella NON- Immune  Varicella Immune  RPR NR  HBsAg Neg  HIV NR  GC neg  Chlamydia neg  Genetic screening Negative materniT21  1 hour GTT N/a  3 hour GTT 77, 103, 103, 83   GBS  negative   FHT: 140bpm, mod var, + accels, no decels TOCO: q2-29min, mild to palp SVE:  1-2/50/-2, soft/mid   Cephalic by leopolds/SVE  No results found.  Assessment:  Dorothymae Maciver is a 31 y.o. G2P0010 female at [redacted]w[redacted]d with GHTN.   Plan:  1. Admit to Labor & Delivery; consents reviewed and obtained  2. Fetal Well being  - Fetal Tracing: Cat I - Group B Streptococcus ppx indicated: negative - Presentation: cephalic confirmed by exam   3. Routine OB: - Prenatal labs reviewed, as above - Rh A pos - CBC, T&S, RPR on admit - Clear fluids, IVF  4. Induction of Labor -  Contractions: external toco in place -  Pelvis adequate for TOL -  Plan for induction with cytotec, Pitocin, consider cook cath and AROM -  Plan for continuous fetal monitoring  -  Maternal pain control as desired - Anticipate vaginal delivery  5. Post Partum Planning: Tdap: Given 03/29/22 Flu: declined   Contraception: IUD Mirena Feeding preference: breast feeding  Randa Ngo, CNM 05/29/22 5:17 PM

## 2022-05-29 NOTE — Anesthesia Preprocedure Evaluation (Signed)
Anesthesia Evaluation  Patient identified by MRN, date of birth, ID band Patient awake    Reviewed: Allergy & Precautions, H&P , NPO status , Patient's Chart, lab work & pertinent test results  Airway Mallampati: II       Dental no notable dental hx.    Pulmonary neg pulmonary ROS          Cardiovascular hypertension,      Neuro/Psych negative neurological ROS  negative psych ROS   GI/Hepatic Neg liver ROS,GERD  ,,  Endo/Other  negative endocrine ROS    Renal/GU negative Renal ROS  negative genitourinary   Musculoskeletal   Abdominal   Peds  Hematology negative hematology ROS (+)   Anesthesia Other Findings   Reproductive/Obstetrics (+) Pregnancy                             Anesthesia Physical Anesthesia Plan  ASA: 2  Anesthesia Plan: Epidural   Post-op Pain Management:    Induction:   PONV Risk Score and Plan:   Airway Management Planned:   Additional Equipment:   Intra-op Plan:   Post-operative Plan:   Informed Consent: I have reviewed the patients History and Physical, chart, labs and discussed the procedure including the risks, benefits and alternatives for the proposed anesthesia with the patient or authorized representative who has indicated his/her understanding and acceptance.       Plan Discussed with: Anesthesiologist and CRNA  Anesthesia Plan Comments:        Anesthesia Quick Evaluation

## 2022-05-29 NOTE — Anesthesia Procedure Notes (Signed)
Epidural Patient location during procedure: OB Start time: 05/29/2022 5:27 PM End time: 05/29/2022 5:48 PM  Staffing Anesthesiologist: Lenard Simmer, MD Resident/CRNA: Elmarie Mainland, CRNA Performed: resident/CRNA   Preanesthetic Checklist Completed: patient identified, IV checked, site marked, risks and benefits discussed, surgical consent, monitors and equipment checked, pre-op evaluation and timeout performed  Epidural Patient position: sitting Prep: ChloraPrep Patient monitoring: heart rate, continuous pulse ox and blood pressure Approach: midline Location: L3-L4 Injection technique: LOR saline  Needle:  Needle type: Tuohy  Needle gauge: 17 G Needle length: 9 cm and 9 Needle insertion depth: 7.5 cm Catheter type: closed end flexible Catheter size: 19 Gauge Catheter at skin depth: 12 cm Test dose: negative and 1.5% lidocaine with Epi 1:200 K  Assessment Sensory level: T10 Events: blood not aspirated, no cerebrospinal fluid, injection not painful, no injection resistance, no paresthesia and negative IV test  Additional Notes 2 attempt Pt. Evaluated and documentation done after procedure finished. Patient identified. Risks/Benefits/Options discussed with patient including but not limited to bleeding, infection, nerve damage, paralysis, failed block, incomplete pain control, headache, blood pressure changes, nausea, vomiting, reactions to medication both or allergic, itching and postpartum back pain. Confirmed with bedside nurse the patient's most recent platelet count. Confirmed with patient that they are not currently taking any anticoagulation, have any bleeding history or any family history of bleeding disorders. Patient expressed understanding and wished to proceed. All questions were answered. Sterile technique was used throughout the entire procedure. Please see nursing notes for vital signs. Test dose was given through epidural catheter and negative prior to continuing  to dose epidural or start infusion. Warning signs of high block given to the patient including shortness of breath, tingling/numbness in hands, complete motor block, or any concerning symptoms with instructions to call for help. Patient was given instructions on fall risk and not to get out of bed. All questions and concerns addressed with instructions to call with any issues or inadequate analgesia.    Patient tolerated the insertion well without immediate complications.Reason for block:procedure for pain

## 2022-05-29 NOTE — Progress Notes (Signed)
Labor Progress Note  Lauren Duran is a 31 y.o. G2P0010 at [redacted]w[redacted]d by LMP admitted for induction of labor due to [facility].  Subjective: mostly comfortable after epidural  Objective: BP 113/72   Pulse 80   Temp 98.7 F (37.1 C) (Oral)   Resp 16   LMP 09/12/2021 (Exact Date)   SpO2 99%  Notable VS details: reviewed  Vitals:   05/29/22 1127 05/29/22 1333 05/29/22 1545 05/29/22 1735  BP: 115/67 129/77 134/80 (!) 147/86   05/29/22 1737 05/29/22 1742 05/29/22 1747 05/29/22 1752  BP: (!) 141/70 134/73 128/68 127/73   05/29/22 1757 05/29/22 1802 05/29/22 1817 05/29/22 2105  BP: 117/66 124/73 125/80 113/72     Fetal Assessment: FHT:  FHR: 145 bpm, variability: moderate,  accelerations:  Present,  decelerations:  Absent Category/reactivity:  Category I UC:   regular, every 2-4 minutes; Pitocin 30mu/min SVE:   3/60/-1, mid/soft Membrane status: intact Amniotic color: n/a  Labs: Lab Results  Component Value Date   WBC 14.6 (H) 05/29/2022   HGB 12.0 05/29/2022   HCT 36.0 05/29/2022   MCV 84.7 05/29/2022   PLT 262 05/29/2022    Assessment / Plan: Induction of labor due to gestational hypertension,  progressing well on pitocin  Labor:  s/p 3 doses of cytotec, now on low dose PItocin with epidural in place.  Preeclampsia:  labs stable Fetal Wellbeing:  Category I Pain Control:  Epidural I/D:   GBS Neg Anticipated MOD:  NSVD  Prudencio Pair Kelli Egolf, CNM 05/29/2022, 9:21 PM

## 2022-05-29 NOTE — Progress Notes (Signed)
Patient ID: Lauren Duran, female   DOB: 02/22/1991, 31 y.o.   MRN: [id number] Udpated on progress from [place] , cnm . Cat 1 fetal monitoring . Checking on GBS status

## 2022-05-30 ENCOUNTER — Encounter: Payer: Self-pay | Admitting: Obstetrics and Gynecology

## 2022-05-30 LAB — CBC
HCT: 34.5 % — ABNORMAL LOW (ref 36.0–46.0)
Hemoglobin: 11.2 g/dL — ABNORMAL LOW (ref 12.0–15.0)
MCH: 28 pg (ref 26.0–34.0)
MCHC: 32.5 g/dL (ref 30.0–36.0)
MCV: 86.3 fL (ref 80.0–100.0)
Platelets: 234 10*3/uL (ref 150–400)
RBC: 4 MIL/uL (ref 3.87–5.11)
RDW: 13.6 % (ref 11.5–15.5)
WBC: 19 10*3/uL — ABNORMAL HIGH (ref 4.0–10.5)
nRBC: 0 % (ref 0.0–0.2)

## 2022-05-30 MED ORDER — NIFEDIPINE ER OSMOTIC RELEASE 30 MG PO TB24
30.0000 mg | ORAL_TABLET | Freq: Every day | ORAL | Status: DC
  Administered 2022-05-30 – 2022-05-31 (×2): 30 mg via ORAL
  Filled 2022-05-30 (×2): qty 1

## 2022-05-30 MED ORDER — DIBUCAINE (PERIANAL) 1 % EX OINT: 1.0000 | TOPICAL_OINTMENT | CUTANEOUS | Status: DC | PRN

## 2022-05-30 MED ORDER — MEASLES, MUMPS & RUBELLA VAC IJ SOLR: 0.5000 mL | INTRAMUSCULAR | Status: DC | PRN

## 2022-05-30 MED ORDER — MAGNESIUM HYDROXIDE 400 MG/5ML PO SUSP: 30.0000 mL | ORAL | Status: DC | PRN

## 2022-05-30 MED ORDER — PANTOPRAZOLE SODIUM 40 MG PO TBEC
40.0000 mg | DELAYED_RELEASE_TABLET | Freq: Every day | ORAL | Status: DC
  Administered 2022-05-30: 40 mg via ORAL
  Filled 2022-05-30 (×3): qty 1

## 2022-05-30 MED ORDER — WITCH HAZEL-GLYCERIN EX PADS
1.0000 | MEDICATED_PAD | CUTANEOUS | Status: DC | PRN
  Administered 2022-05-30: 1 via TOPICAL
  Filled 2022-05-30: qty 100

## 2022-05-30 MED ORDER — IBUPROFEN 600 MG PO TABS
600.0000 mg | ORAL_TABLET | Freq: Four times a day (QID) | ORAL | Status: DC
  Administered 2022-05-30 – 2022-05-31 (×5): 600 mg via ORAL
  Filled 2022-05-30 (×5): qty 1

## 2022-05-30 MED ORDER — COCONUT OIL OIL: 1.0000 | TOPICAL_OIL | Status: DC | PRN

## 2022-05-30 MED ORDER — BENZOCAINE-MENTHOL 20-0.5 % EX AERO
1.0000 | INHALATION_SPRAY | CUTANEOUS | Status: DC | PRN
  Administered 2022-05-30: 1 via TOPICAL
  Filled 2022-05-30 (×2): qty 56

## 2022-05-30 MED ORDER — SIMETHICONE 80 MG PO CHEW: 80.0000 mg | CHEWABLE_TABLET | ORAL | Status: DC | PRN

## 2022-05-30 MED ORDER — ONDANSETRON HCL 4 MG/2ML IJ SOLN: 4.0000 mg | INTRAMUSCULAR | Status: DC | PRN

## 2022-05-30 MED ORDER — ONDANSETRON HCL 4 MG PO TABS: 4.0000 mg | ORAL_TABLET | ORAL | Status: DC | PRN

## 2022-05-30 MED ORDER — ACETAMINOPHEN 325 MG PO TABS
650.0000 mg | ORAL_TABLET | ORAL | Status: DC | PRN
  Administered 2022-05-30 – 2022-05-31 (×3): 650 mg via ORAL
  Filled 2022-05-30 (×4): qty 2

## 2022-05-30 NOTE — Discharge Instructions (Signed)

## 2022-05-30 NOTE — Progress Notes (Signed)
Post Partum Day 0 Subjective: Doing well, no complaints.  Tolerating regular diet, pain with PO meds, voiding and ambulating without difficulty.  No CP SOB Fever,Chills, N/V or leg pain; denies nipple or breast pain, no HA change of vision, RUQ/epigastric pain  Objective: BP 129/84 (BP Location: Left Arm)   Pulse 67   Temp 98.3 F (36.8 C) (Oral)   Resp 20   LMP 09/12/2021 (Exact Date)   SpO2 99%   Breastfeeding Unknown    Physical Exam:  General: NAD Breasts: soft/nontender CV: RRR Pulm: nl effort, CTABL Abdomen: soft, NT, BS x 4 Perineum: minimal edema, repair well approximated Lochia: moderate Uterine Fundus: fundus firm and 1 fb below umbilicus DVT Evaluation: no cords, ttp LEs   Recent Labs    05/29/22 0039 05/30/22 0648  HGB 12.0 11.2*  HCT 36.0 34.5*  WBC 14.6* 19.0*  PLT 262 234   Vitals:   05/29/22 1802 05/29/22 1817 05/29/22 2105 05/30/22 0350  BP: 124/73 125/80 113/72 110/62   05/30/22 0400 05/30/22 0415 05/30/22 0430 05/30/22 0500  BP: 120/74 128/74 123/77 127/74   05/30/22 0515 05/30/22 0530 05/30/22 0638 05/30/22 0749  BP: 135/72 139/65 (!) 152/87 129/84     Assessment/Plan: 31 y.o. G2P1011 postpartum day # 0  - Continue routine PP care - gestational HTN: 3 elevated BP since delivery, will start Procardia 30mg  XL - Lactation consult PRN - Discussed contraceptive options including implant, IUDs hormonal and non-hormonal, injection, pills/ring/patch, condoms, and NFP.  - Acute blood loss anemia - hemodynamically stable and asymptomatic; start po ferrous sulfate BID with stool softeners  - Immunization status: all Imms up to date  Disposition: Does not desire Dc home today.   , CNM 05/30/2022 8:44 AM

## 2022-05-30 NOTE — Lactation Note (Signed)
This note was copied from a baby's chart. Lactation Consultation Note  Patient Name: Lauren Duran Today's Date: 05/30/2022 Reason for consult: Follow-up assessment;Difficult latch;Early term 37-38.6wks;Other (Comment) (low glucose) Age:31 hours  Maternal Data Has patient been taught Hand Expression?: Yes Does the patient have breastfeeding experience prior to this delivery?: No  Feeding Mother's Current Feeding Choice: Breast Milk Nipple Type: Slow - flow  LATCH Score Latch: Too sleepy or reluctant, no latch achieved, no sucking elicited.  Audible Swallowing: None  Type of Nipple: Flat  Comfort (Breast/Nipple): Soft / non-tender  Hold (Positioning): No assistance needed to correctly position infant at breast.  LATCH Score: 5   Lactation Tools Discussed/Used    Interventions Interventions: DEBP Following blood glucose of 36mg /dL, attempted latch on right side in cradle hold with stimulation, hand expression, and rubbing nipple from nose to mouth. A few shallow latches obtained but no consistent sucking and no audible swallows. Mom calm with optimistic view and strong partner support. Comfortable with positioning and attempting latch. Worked for 15 minutes on breastfeeding and followed with supplement of 49mL formula fed with bottle with slow flow nipple by father of baby. DEBP with 6mm flanged introduced and session lasting 10 minutes with drops of colostrum obtained. Pump cleaning and assembly as well as milk storage reviewed.  Plan is to attempt feeding at the breast first with cues or every 2-3 hours followed by bottle supplement and DEBP if no latch obtained.   Discharge Pump: DEBP WIC Program: No  Consult Status Consult Status: Follow-up    26m 05/30/2022, 10:34 PM

## 2022-05-30 NOTE — Lactation Note (Signed)
This note was copied from a baby's chart. Lactation Consultation Note  Patient Name: Lauren Duran Today's Date: 05/30/2022 Reason for consult: Initial assessment;Primapara;Early term 37-38.6wks Age:31 hours  Maternal Data Has patient been taught Hand Expression?: Yes Does the patient have breastfeeding experience prior to this delivery?: No  P1, SVD 11 hours ago. Mom induced at 37 weeks due to high blood pressure. Mom desires breastfeeding.  Feeding Mother's Current Feeding Choice: Breast Milk  Baby has fed post delivery and then had only attempts. Baby is sleepy, there has been expressed colostrum given throughout the day per parents. LC able to demonstrate hand expression and see drops.  LATCH Score Latch: Repeated attempts needed to sustain latch, nipple held in mouth throughout feeding, stimulation needed to elicit sucking reflex.  Audible Swallowing: None  Type of Nipple: Everted at rest and after stimulation  Comfort (Breast/Nipple): Soft / non-tender  Hold (Positioning): Assistance needed to correctly position infant at breast and maintain latch.  LATCH Score: 6  LC at bedside. Mom skin to skin with baby in semi-cradle hold. LC observed attempts with mom feeding baby; baby not showing a lot of interest.  We opted for football hold for mom to practice. Baby placed in football hold, initially a little more alert, but no latch achieved as baby did not open to eat.  Lactation Tools Discussed/Used    Interventions Interventions: Breast feeding basics reviewed;Assisted with latch;Skin to skin;Hand express;Adjust position;Support pillows;Position options;Education (Feeding patterns/behaviors, early cues/on demand feeding, behaviors of 37 week infants, 8-12 attempts in first 24 hours,)  Baby is sleepy, and not waking easily for feeds at this time. Mom and dad both very aware and trying frequently as baby remains skin to skin with temperature up and down. Mom has friend  in L&D at [facility] who has been verbally helping with tips and strategies. Mom has hand expressed into baby's mouth and even rubbed drops onto the baby's gums.  We reviewed typical feeding behavior of babies born a few weeks early, importance of frequent attempts and feeding efforts. Expression containers provided for mom to be begin to collect colostrum from hand expression with option of setting up DEBP if baby remains sleepy.  Discharge Pump: Personal  Consult Status Consult Status: Follow-up  Whiteboard updated.  Danford Bad 05/30/2022, 2:34 PM

## 2022-05-30 NOTE — Discharge Summary (Signed)
Obstetrical Discharge Summary  Patient Name: Lauren Duran DOB: 1990/09/26 MRN: [id number]  Date of Admission: 05/29/2022 Date of Delivery: 05/30/22 Delivered by: Dala Dock CNM  Date of Discharge: 05/31/2022  Primary OB: [facility] [id number] last menstrual period was 09/12/2021 (exact date). EDC Estimated Date of Delivery: 06/19/22 Gestational Age at Delivery: [redacted]w[redacted]d   Antepartum complications:  Gestational hypertension, third trimester   Obesity affecting pregnancy in third trimester, unspecified obesity type  Uterine size date discrepancy pregnancy- growth [place] 10/26 WNL Rubella non-immune status, antepartum   Admitting Diagnosis: Gestational hypertension [O13.9]  Secondary Diagnosis: Patient Active Problem List   Diagnosis Date Noted   NSVD (normal spontaneous vaginal delivery) 05/31/2022   Rubella non-immune status, antepartum 05/24/2022   Elevated blood pressure affecting pregnancy in third trimester, antepartum 05/21/2022   Uterine size date discrepancy pregnancy 04/18/2022   Obesity affecting pregnancy in third trimester 02/27/2022   Encounter for supervision of normal first pregnancy in third trimester 11/28/2021   Supervision of high risk pregnancy in third trimester 11/28/2021    Discharge Diagnosis: Term Pregnancy Delivered and Gestational Hypertension      Augmentation: Pitocin and Cytotec Complications: None Intrapartum complications/course: see delivery notes, IOL with cytotec then Pitocin, epidural given, SROM and spontaneous progression to C/C/+2 with effective pushing.  Delivery Type: spontaneous vaginal delivery Anesthesia: epidural anesthesia Placenta: spontaneous To Pathology: No  Laceration: 1st deg perineal, bilateral sidewall, right labial  Episiotomy: none Newborn Data: Live born female "Kelsie" Birth Weight: 3200g (7lb0.9oz)  APGAR: 8/9  Newborn Delivery   Birth date/time: 05/30/2022 03:11:00 Delivery type: Vaginal, Spontaneous       Postpartum Procedures: none Edinburgh:     05/30/2022    4:36 PM  Edinburgh Postnatal Depression Scale Screening Tool  I have been able to laugh and see the funny side of things. 0  I have looked forward with enjoyment to things. 0  I have blamed myself unnecessarily when things went wrong. 0  I have been anxious or worried for no good reason. 0  I have felt scared or panicky for no good reason. 0  Things have been getting on top of me. 0  I have been so unhappy that I have had difficulty sleeping. 0  I have felt sad or miserable. 0  I have been so unhappy that I have been crying. 0  The thought of harming myself has occurred to me. 0  Edinburgh Postnatal Depression Scale Total 0     Post partum course:  Patient had an uncomplicated postpartum course.  By time of discharge on PPD#1, her pain was controlled on oral pain medications; she had appropriate lochia and was ambulating, voiding without difficulty and tolerating regular diet.  She was deemed stable for discharge to home.     Discharge Physical Exam:  BP 127/86 (BP Location: Left Arm)   Pulse 96   Temp 98.2 F (36.8 C) (Oral)   Resp 18   LMP 09/12/2021 (Exact Date)   SpO2 100%   Breastfeeding Unknown   General: NAD CV: RRR Pulm: CTABL, nl effort ABD: s/nd/nt, fundus firm and below the umbilicus Lochia: moderate Perineum:minimal edema/repair well approximated DVT Evaluation: LE non-ttp, no evidence of DVT on exam.  Hemoglobin  Date Value Ref Range Status  05/30/2022 11.2 (L) 12.0 - 15.0 g/dL Final   HGB  Date Value Ref Range Status  09/05/2012 12.7 12.0 - 16.0 g/dL Final   HCT  Date Value Ref Range Status  05/30/2022 34.5 (L) 36.0 - 46.0 %  Final  09/05/2012 37.3 35.0 - 47.0 % Final    Risk assessment for postpartum VTE and prophylactic treatment: Very high risk factors: None High risk factors: BMI 40-50 kg/m2 Moderate risk factors: None  Postpartum VTE prophylaxis with LMWH not  indicated  Disposition: stable, discharge to home. Baby Feeding: breast feeding Baby Disposition: home with mom  Rh Immune globulin indicated: No Rubella vaccine given: was given Varivax vaccine given: was not indicated Flu vaccine given in AP setting: declined Tdap vaccine given in AP setting: Yes 03/29/22  Contraception: IUD  Prenatal Labs:  Blood type/Rh A Pos  Antibody screen neg  Rubella NON- Immune  Varicella Immune  RPR NR  HBsAg Neg  HIV NR  GC neg  Chlamydia neg  Genetic screening Negative materniT21  1 hour GTT N/a  3 hour GTT 77, 103, 103, 83   GBS  negative     Plan:  Lauren Duran was discharged to home in good condition.   Discharge Medications: Allergies as of 05/31/2022       Reactions   Red Dye Hives        Medication List     STOP taking these medications    aspirin 81 MG chewable tablet       TAKE these medications    acetaminophen 325 MG tablet Commonly known as: Tylenol Take 2 tablets (650 mg total) by mouth every 6 (six) hours as needed (for pain scale < 4).   doxylamine (Sleep) 25 MG tablet Commonly known as: UNISOM Take 25 mg by mouth at bedtime as needed for sleep.   ibuprofen 600 MG tablet Commonly known as: ADVIL Take 1 tablet (600 mg total) by mouth every 6 (six) hours as needed.   NIFEdipine 30 MG 24 hr tablet Commonly known as: ADALAT CC Take 1 tablet (30 mg total) by mouth daily.   omeprazole 10 MG capsule Commonly known as: PRILOSEC Take 10 mg by mouth daily.   prenatal multivitamin Tabs tablet Take 1 tablet by mouth daily at 12 noon.         Follow-up Information     [facility] Follow up in 1 week(s).   Why: postpartum BP check Contact information: [address] [facility] [place] [address] [phone]        McVey, Prudencio Pair, CNM Follow up in 6 week(s).   Specialty: Obstetrics and Gynecology Why: postpartum Contact information: [address] [place]  [place] [address] [phone]                 Signed: Cyril Mourning  05/31/2022 8:55 AM

## 2022-05-31 MED ORDER — IBUPROFEN 600 MG PO TABS: 600.0000 mg | ORAL_TABLET | Freq: Four times a day (QID) | ORAL | 0 refills | Status: DC | PRN

## 2022-05-31 MED ORDER — NIFEDIPINE ER 30 MG PO TB24: 30.0000 mg | ORAL_TABLET | Freq: Every day | ORAL | 11 refills | Status: AC

## 2022-05-31 MED ORDER — ACETAMINOPHEN 325 MG PO TABS: 650.0000 mg | ORAL_TABLET | Freq: Four times a day (QID) | ORAL | Status: AC | PRN

## 2022-05-31 NOTE — Lactation Note (Signed)
This note was copied from a baby's chart. Lactation Consultation Note  Patient Name: Lauren Duran Today's Date: 05/31/2022 Reason for consult: Follow-up assessment;Early term 37-38.6wks;RN request Age:31 hours  Maternal Data  This is mom's first baby, SVD. Mom with history of gestation hypertension, obesity. Baby is 37 1/7 weeks.  Today on follow-up mom reports she tried breastfeeding the baby for 15 minutes and baby was sleepy.Per mom dad bottle fed baby formula supplement and 52ml's of her pumped colostrum for a total of 19 ml's after the breastfeeding attempt. 1 hours post bottle feed baby appeared to be showing hunger cues and dad fed an additional 10 ml's.   Has patient been taught Hand Expression?: Yes Does the patient have breastfeeding experience prior to this delivery?: No  Feeding Mother's Current Feeding Choice: Breast Milk (Mom using formula to bridge gap for early term baby with inconsistent breastfeeding. Mom is pumping to establish her milk supply.)  Mom reports she attempted to breastfeed but baby was sleepy. She tried for 15 minutes and then dad gave baby bottle feed. Mom getting ready to pump. Mom reports the #24 mm flanges are too big. Mom fit for #21 mm shields. Observed pumping with new shields. Mom reports she is way more comfortable. Mom expressed 78ml which was given to baby with curved tip syringe. Support and encouragement provided to first time mom. Reviewed with mom and dad characteristics of early term baby and potential for inconsistent breastfeeding until baby's original due date. Discussed early term feeding plan and strategies to establish mom's milk supply.  Lactation Tools Discussed/Used Tools: Pump Breast pump type: Double-Electric Breast Pump Pump Education: Setup, frequency, and cleaning;Milk Storage Reason for Pumping: early term baby with inconsistent breastfeeding Pumping frequency: goal of 8 times in 24 hours until baby is consistently  breastfeeding. Pumped volume: 1 mL  Interventions Interventions: DEBP;Education (Reviewed early term breastfeeding behaviors and potential for inconsistent breastfeeding related to early term birth.)  Provided early term feeding plan to parents: - offer baby to eat at least every 3 hours or sooner if baby shows feeding cues with a goal of at least 8 feeds/24 hours.  - if mom offers baby to breastfeed at a feeding session mom will observe baby is actively feeding looking and listening for swallows. - if baby falls asleep, doesn't latch, or mom does not hear/see swallows mom will stop the breastfeeding attempt and dad will offer baby any pumped breastmilk and/or the formula supplement. -for now mom will time limit breastfeeding attempt to 10-15 minutes. -parents will offer baby a supplemental feeding after each breastfeeding/ breastfeeding attempt as well as for those feedings mom does not breastfeed. (Goal of at least 8 feeding in 24 hours). Overtime as the baby becomes more and more proficient at breastfeeding she will take less and less of the supplemental feeding after breastfeeding. -parents will increase volumes of supplementation and follow baby's signs for satiety. (Volumes of:24-48 hours 10-20 ml's 48-72 hours : 20-30 ml's after 72 hours a minimum of 30 ml's advancing as tolerated). -until baby is consistently breastfeeding mom will goal to pump after breastfeeding/feeding attempts/and times baby receives a bottle and doesn't breastfeed.(Goal is 8 pump sessions in 24 hours). -Parents are recommended to keep feeding diary and wet/stool diapers and take to first pediatric check-up. -Once home if baby refuses 2 feedings in a row(6 hours) parents will call the baby's pediatrician Discharge Pump: Personal Outpatient recommendation. Breast care and engorgement.  Consult Status Consult Status: PRN Date: 05/31/22 Follow-up  type: In-patient  Update provided to care nurse.  Fuller Song 05/31/2022, 1:27 PM

## 2022-05-31 NOTE — Anesthesia Postprocedure Evaluation (Signed)
Anesthesia Post Note  Patient: Lauren Duran  Procedure(s) Performed: AN AD HOC LABOR EPIDURAL  Patient location during evaluation: Mother Baby Anesthesia Type: Epidural Level of consciousness: oriented and awake and alert Pain management: pain level controlled Vital Signs Assessment: post-procedure vital signs reviewed and stable Respiratory status: spontaneous breathing and respiratory function stable Cardiovascular status: blood pressure returned to baseline and stable Postop Assessment: no headache, no backache, no apparent nausea or vomiting and able to ambulate Anesthetic complications: no  No notable events documented.   Last Vitals:  Vitals:   05/30/22 2319 05/31/22 0338  BP: 116/76 105/70  Pulse: 78 76  Resp: 18 18  Temp: 37 C 36.6 C  SpO2: 100% 99%    Last Pain:  Vitals:   05/31/22 0645  TempSrc:   PainSc: Asleep                 Starling Manns

## 2022-05-31 NOTE — Progress Notes (Signed)
Discharged instructions reviewed with patient and significant other.  Questions answered.  Belongings gathered.  Printed copies given to patient for resource after discharge home. Patient transported to medical mall entrance for discharge home.

## 2022-07-22 ENCOUNTER — Telehealth: Payer: Self-pay

## 2022-07-25 ENCOUNTER — Telehealth: Payer: Self-pay

## 2022-07-25 NOTE — Telephone Encounter (Signed)
[facility]- Discharge Call Backs-Talked to pt on the phone about the following below. Shortened version because she was busy again. 1-Do you have any questions or concerns about yourself as you heal? 2-Any concerns or questions about your baby? 3-How was your stay at the hospital? You should be receiving a survey in the mail soon.   We would really appreciate it if you could fill that out for [place] and return it in the mail.  We value the feedback to make improvements and continue the great work we do.   If you have any questions please feel free to call me back at [phone]

## 2023-03-07 ENCOUNTER — Encounter: Payer: Self-pay | Admitting: Podiatry

## 2023-03-07 ENCOUNTER — Ambulatory Visit (INDEPENDENT_AMBULATORY_CARE_PROVIDER_SITE_OTHER): Payer: 59 | Admitting: Podiatry

## 2023-03-07 DIAGNOSIS — R52 Pain, unspecified: Secondary | ICD-10-CM

## 2023-03-07 DIAGNOSIS — M7751 Other enthesopathy of right foot: Secondary | ICD-10-CM

## 2023-03-07 MED ORDER — METHYLPREDNISOLONE 4 MG PO TBPK: ORAL_TABLET | ORAL | 0 refills | Status: AC

## 2023-03-07 MED ORDER — MELOXICAM 15 MG PO TABS: 15.0000 mg | ORAL_TABLET | Freq: Every day | ORAL | 1 refills | Status: AC

## 2023-03-07 MED ORDER — BETAMETHASONE SOD PHOS & ACET 6 (3-3) MG/ML IJ SUSP
3.0000 mg | Freq: Once | INTRAMUSCULAR | Status: AC
  Administered 2023-03-07: 3 mg via INTRA_ARTICULAR

## 2023-03-07 NOTE — Progress Notes (Signed)
   Chief Complaint  Patient presents with   Foot Pain    Patient is here for bruised bottom of foot    HPI: 32 y.o. female presenting today for new complaint of pain and tenderness associated to the fourth toe of the right foot ongoing for few months now.  Idiopathic onset.  Denies any history of injury.  She does have a history of surgery and she has been doing great up until about 3-4 months ago and the pain developed.  She wears good supportive tennis shoes and does not go barefoot  Past Medical History:  Diagnosis Date   Medical history non-contributory     Past Surgical History:  Procedure Laterality Date   foot Right    INDUCED ABORTION     TONSILLECTOMY      Allergies  Allergen Reactions   Red Dye #40 (Allura Red) Hives     Physical Exam: General: The patient is alert and oriented x3 in no acute distress.  Dermatology: Skin is warm, dry and supple bilateral lower extremities.   Vascular: Palpable pedal pulses bilaterally. Capillary refill within normal limits.  No appreciable edema.  No erythema.  Neurological: Grossly intact via light touch  Musculoskeletal Exam: Tenderness with palpation noted to the fourth MTP of the right foot.  Radiographic Exam RT foot 03/07/2023:  Normal osseous mineralization. Joint spaces preserved.  No fractures or osseous irregularities noted.  Assessment/Plan of Care: 1.  Fourth MTP capsulitis right 2. H/o Lapidus and adductoplasty RT. DOS: 05/10/2021   -Patient evaluated.  X-rays reviewed -Injection of 0.5 cc Celestone Soluspan injected in the fourth MTP of the right foot -Prescription for Medrol Dosepak -Prescription for meloxicam 15 mg daily -Cam boot dispensed.  WBAT x 4 weeks -Patient has a pair custom molded orthotics that she received prior to surgery.  She may try to wear those after wearing the cam boot for 3-4 weeks -OTC power step insoles were also dispensed.  Patient may begin to transition out of the cam boot into the  good tennis shoes with arch supports in 3-4 weeks -Quarter inch felt metatarsal pads were also provided for the patient to apply to the insoles of her shoes or the insoles of the power steps -Advised against going barefoot -Return to clinic 4 weeks  Works for [employer]      Felecia Shelling, DPM [facility]  Dr. Felecia Shelling, DPM    [address] [place], [place] [address]                Office [phone]  Fax [phone]

## 2023-04-04 ENCOUNTER — Ambulatory Visit: Payer: 59 | Admitting: Podiatry

## 2023-04-04 ENCOUNTER — Encounter: Payer: Self-pay | Admitting: Podiatry

## 2023-04-04 VITALS — BP 115/78 | HR 97

## 2023-04-04 DIAGNOSIS — M7751 Other enthesopathy of right foot: Secondary | ICD-10-CM | POA: Diagnosis not present

## 2023-04-04 NOTE — Progress Notes (Signed)
   Chief Complaint  Patient presents with   Foot Pain    "I don't this boot is helping like it should.  When I wear a shoe or a sock it hurts.  It's like my toes are compressed together."    HPI: 32 y.o. female presenting today for follow-up evaluation of fourth MTP capsulitis to the right foot.  She continues to have some pain and tenderness when she is not wearing her cam boot.  She says that when she wears a cam boot it helps significantly and she has no pain associated with the foot when she wears the cam boot.  Past Medical History:  Diagnosis Date   Medical history non-contributory     Past Surgical History:  Procedure Laterality Date   foot Right    INDUCED ABORTION     TONSILLECTOMY      Allergies  Allergen Reactions   Red Dye #40 (Allura Red) Hives     Physical Exam: General: The patient is alert and oriented x3 in no acute distress.  Dermatology: Skin is warm, dry and supple bilateral lower extremities.   Vascular: Palpable pedal pulses bilaterally. Capillary refill within normal limits.  No appreciable edema.  No erythema.  Neurological: Grossly intact via light touch  Musculoskeletal Exam: There continues to be some tenderness with palpation noted to the fourth MTP of the right foot.  Radiographic Exam RT foot 03/07/2023:  Normal osseous mineralization. Joint spaces preserved.  No fractures or osseous irregularities noted.  Assessment/Plan of Care: 1.  Fourth MTP capsulitis right 2. H/o Lapidus and adductoplasty RT. DOS: 05/10/2021   -Patient evaluated.   -Recommend that the patient begins to transition out of the cam boot into good supportive tennis shoes.  She has both custom orthotics and OTC power step insoles.  Recommend wearing them.  She has not been wearing her orthotics or insoles with metatarsal pads that were provided last visit -Continue meloxicam 15 mg daily -Patient will contact me in about 6 weeks to let me know if there is any improvement.  If  there is no improvement may consider MRI of the toes right foot  Works for [employer]      Felecia Shelling, DPM [facility]  Dr. Felecia Shelling, DPM    [address] [place], [place] [address]                Office [phone]  Fax [phone]

## 2023-12-01 ENCOUNTER — Encounter: Payer: Self-pay | Admitting: Podiatry

## 2023-12-02 ENCOUNTER — Telehealth: Payer: Self-pay | Admitting: Podiatry

## 2023-12-02 NOTE — Telephone Encounter (Signed)
 Mychart message sent back to patient.

## 2023-12-02 NOTE — Telephone Encounter (Signed)
 Patient called and said that her right foot is swelling, and would like to know if she can come by to get a compression sock to help with the swelling. She says it's the same foot that she had surgery on in the past.

## 2023-12-03 NOTE — Telephone Encounter (Signed)
 Patient called back- states pain is progressively getting worse. Do we need to get her into the office or is there something to be done to help alleviate the pain?  Please advise,thanks.

## 2023-12-16 ENCOUNTER — Ambulatory Visit (INDEPENDENT_AMBULATORY_CARE_PROVIDER_SITE_OTHER): Admitting: Podiatry

## 2023-12-16 ENCOUNTER — Encounter: Payer: Self-pay | Admitting: Podiatry

## 2023-12-16 ENCOUNTER — Ambulatory Visit (INDEPENDENT_AMBULATORY_CARE_PROVIDER_SITE_OTHER)

## 2023-12-16 VITALS — Ht 65.0 in | Wt 235.0 lb

## 2023-12-16 DIAGNOSIS — M7751 Other enthesopathy of right foot: Secondary | ICD-10-CM | POA: Diagnosis not present

## 2023-12-16 DIAGNOSIS — G5761 Lesion of plantar nerve, right lower limb: Secondary | ICD-10-CM

## 2023-12-16 DIAGNOSIS — T8484XA Pain due to internal orthopedic prosthetic devices, implants and grafts, initial encounter: Secondary | ICD-10-CM

## 2023-12-16 NOTE — Progress Notes (Signed)
 No chief complaint on file.   HPI: 33 y.o. female presenting today for evaluation of pain and tenderness associated to the left forefoot.  She has now had this pain and tenderness for approximately 2 years now.  Aggravated by tight fitting shoes.  She says that now even wide fitting shoes she continues to have pain and tenderness associated this area.  History of Lapidus and adductoplasty surgery to the right foot.  DOS: 05/10/2021.  She states that she no longer has any bunion pain but she does feel the hardware around the first TMT of the left foot.  When she feels the hardware it can become symptomatic in shoes.  Past Medical History:  Diagnosis Date   Medical history non-contributory     Past Surgical History:  Procedure Laterality Date   foot Right    INDUCED ABORTION     TONSILLECTOMY      Allergies  Allergen Reactions   Red Dye #40 (Allura Red) Hives     Physical Exam: General: The patient is alert and oriented x3 in no acute distress.  Dermatology: Skin is warm, dry and supple bilateral lower extremities.   Vascular: Palpable pedal pulses bilaterally. Capillary refill within normal limits.  No appreciable edema.  No erythema.  Neurological: Grossly intact via light touch  Musculoskeletal Exam: Tenderness associated to palpation to the fourth intermetatarsal space of the right foot consistent with Morton's neuroma.  Positive Mulder sign and pain with lateral compression of the metatarsal heads.  Palpation to the lesser MTP as well as range of motion is asymptomatic.  Findings consistent with Morton's neuroma  Radiographic Exam RT foot 12/16/2023:  Orthopedic hardware is stable and intact.  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  No acute fractures identified.  Joint spaces preserved.  Assessment/Plan of Care: 1.  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 H/o Lapidus and adductoplasty RT. DOS: 05/10/2021  3.  Symptomatic hardware right  foot  -Patient evaluated.  X-rays reviewed - Unfortunately the patient continues to have pain and tenderness associated to the chronic Morton's neuroma of the right foot ongoing for a few years now.  She has tried custom orthotics with no relief as well as anti-inflammatory injections.  Also shoe gear modifications.  She tries wearing wide fitting shoes but even the wide fitting shoes continue to cause pain and tenderness. -I do feel it is appropriate to discuss surgery at this time.  Excision of the Morton's neuroma was discussed in detail with the patient including the procedure as well as the postoperative recovery course.  Risk benefits advantages and disadvantages of the procedure were explained at length in detail.  All patient questions were answered.  No guarantees were expressed or implied.  Patient consents and would like to proceed with excision of the Morton's neuroma -In addition to the Morton's neuroma she continues to have intermittent pain and tenderness associated to the hardware around the first TMT of the surgical foot.  She is contemplating having the hardware removed.  The arthrodesis site is stable with complete fusion.  If it continues to be symptomatic she will let me know and we will go ahead and remove the orthopedic hardware to the first TMT. -Authorization for surgery was initiated today.  Surgery will consist of excision of Morton's neuroma right foot.  Possible removal of orthopedic hardware first TMT right -Return to clinic 1 week postop   Works for [employer]      Thresa EMERSON Sar,  DPM [facility]  Dr. Thresa EMERSON Sar, DPM    [address] [place], [place] [address]                Office [phone]  Fax [phone]

## 2023-12-23 ENCOUNTER — Ambulatory Visit: Admitting: Podiatry

## 2023-12-24 ENCOUNTER — Telehealth: Payer: Self-pay | Admitting: Podiatry

## 2023-12-24 NOTE — Telephone Encounter (Signed)
 Received surgical consent forms.   Pt scheduled for surgery on 8/28 she requested date. She is not on any blood thinners but is on a GLP1 and is aware she has to stop that 1 week prior to the surgery.   Pharmacy correct in chart.

## 2024-01-06 ENCOUNTER — Ambulatory Visit: Admitting: Podiatry

## 2024-02-04 ENCOUNTER — Encounter: Payer: Self-pay | Admitting: Podiatry

## 2024-02-05 ENCOUNTER — Telehealth: Payer: Self-pay | Admitting: Podiatry

## 2024-02-05 NOTE — Telephone Encounter (Signed)
 DOS- 02/19/2024  NEURECTOMY RT- 71919 REMOVAL FIXATION DEEP KWIRE/SCREW RT- 20680  [facility] EFFECTIVE DATE- 06/25/2023  FAMILY DEDUCTIBLE- $2250 REMAINING- $0 FAMILY OOP- $7000 REMAINING- $4441.19 COINSURANCE- 20%/$0 COPAY  PER [employer] WEBSITE, NO PRIOR AUTH IS REQUIRED FOR CPT CODE 71919 AND 20680. DECISION ID# [id number]

## 2024-02-17 ENCOUNTER — Telehealth: Payer: Self-pay | Admitting: Podiatry

## 2024-02-17 NOTE — Telephone Encounter (Signed)
 Patient called stating she is having surgery Thursday but she has a bunch of questions about it. Are you going into same incision, will she get crutches, does she need to bring her boot. She would like someone to follow up about her questions.

## 2024-02-18 ENCOUNTER — Other Ambulatory Visit: Payer: Self-pay | Admitting: Podiatry

## 2024-02-18 MED ORDER — ONDANSETRON HCL 8 MG PO TABS: 8.0000 mg | ORAL_TABLET | Freq: Three times a day (TID) | ORAL | 0 refills | Status: AC | PRN

## 2024-02-18 MED ORDER — OXYCODONE-ACETAMINOPHEN 5-325 MG PO TABS: 1.0000 | ORAL_TABLET | ORAL | 0 refills | Status: AC | PRN

## 2024-02-18 MED ORDER — IBUPROFEN 800 MG PO TABS: 800.0000 mg | ORAL_TABLET | Freq: Three times a day (TID) | ORAL | 1 refills | Status: DC

## 2024-02-18 NOTE — Progress Notes (Signed)
 PRN postop

## 2024-02-18 NOTE — Telephone Encounter (Signed)
 I Spoke to pt about the boot and crutches but she had already discussed with the surgery center, She is still waiting on a call from Dr Janit and I sent him a message to make sure he called her today as her surgery is tomorrow. She is frustrated because she has not been seen in a while and has questions that she would like answered before the surgery tomorrow. Dr Janit is to call her back after clinic.  Pt had also left message asking about what time her procedure was and when I called back she has already received the call from surgery center about that as well.

## 2024-02-19 ENCOUNTER — Other Ambulatory Visit: Payer: Self-pay | Admitting: Podiatry

## 2024-02-19 DIAGNOSIS — M960 Pseudarthrosis after fusion or arthrodesis: Secondary | ICD-10-CM | POA: Diagnosis not present

## 2024-02-19 DIAGNOSIS — D492 Neoplasm of unspecified behavior of bone, soft tissue, and skin: Secondary | ICD-10-CM | POA: Diagnosis not present

## 2024-02-19 DIAGNOSIS — Z4889 Encounter for other specified surgical aftercare: Secondary | ICD-10-CM | POA: Diagnosis not present

## 2024-02-19 DIAGNOSIS — G5761 Lesion of plantar nerve, right lower limb: Secondary | ICD-10-CM | POA: Diagnosis not present

## 2024-02-19 DIAGNOSIS — M67471 Ganglion, right ankle and foot: Secondary | ICD-10-CM | POA: Diagnosis not present

## 2024-02-19 MED ORDER — PROMETHAZINE HCL 25 MG PO TABS: 25.0000 mg | ORAL_TABLET | Freq: Three times a day (TID) | ORAL | 0 refills | Status: AC | PRN

## 2024-02-19 NOTE — Progress Notes (Signed)
PRN postop nausea and vomiting

## 2024-02-20 ENCOUNTER — Encounter: Payer: Self-pay | Admitting: Podiatry

## 2024-02-20 NOTE — Progress Notes (Signed)
 Patient called Answering Service, had surgery yesterday for hardware removal. Having pain from the boot advised OK to remove boot while awake and or resting, but needs to be on for any ambulation.

## 2024-02-27 ENCOUNTER — Encounter: Payer: Self-pay | Admitting: Podiatry

## 2024-02-27 ENCOUNTER — Ambulatory Visit (INDEPENDENT_AMBULATORY_CARE_PROVIDER_SITE_OTHER): Admitting: Podiatry

## 2024-02-27 ENCOUNTER — Ambulatory Visit (INDEPENDENT_AMBULATORY_CARE_PROVIDER_SITE_OTHER)

## 2024-02-27 VITALS — Ht 65.0 in | Wt 235.0 lb

## 2024-02-27 DIAGNOSIS — G5761 Lesion of plantar nerve, right lower limb: Secondary | ICD-10-CM

## 2024-02-27 NOTE — Progress Notes (Signed)
   Chief Complaint  Patient presents with   Routine Post Op    POV # 1 DOS 02/19/24 RT 3RD INTERSPACE EXCISION MORTONS NEUROMA, RT POSSIBLE HARDWARE REMOVAL    Subjective:  Patient presents today status post excision of Morton's neuroma third interspace right foot, removal of hardware right foot.  DOS: 02/19/2024.  Doing well.  WBAT cam boot  Past Medical History:  Diagnosis Date   Medical history non-contributory     Past Surgical History:  Procedure Laterality Date   foot Right    INDUCED ABORTION     TONSILLECTOMY      Allergies  Allergen Reactions   Red Dye #40 (Allura Red) Hives    Objective/Physical Exam Neurovascular status intact.  Incision well coapted with sutures intact. No sign of infectious process noted. No dehiscence. No active bleeding noted.  Moderate edema noted to the surgical extremity.  Radiographic Exam RT foot 02/27/2024:  Interval removal of orthopedic hardware to the first metatarsocuneiform.  No acute fractures identified or complicating features.  Assessment: 1. s/p ROH RT foot.  Morton's neurectomy third interspace right foot. DOS: 02/19/2024   Plan of Care:  -Patient was evaluated. X-rays reviewed - Dressings changed -Postop shoe dispensed.  WBAT either at the postop shoe or cam boot what ever is more comfortable -Return to clinic 10 days suture removal   Lauren Duran, DPM [facility]  Dr. Thresa EMERSON Duran, DPM    [address] [place], [place] [address]                Office [phone]  Fax [phone]

## 2024-03-05 ENCOUNTER — Encounter: Admitting: Podiatry

## 2024-03-05 ENCOUNTER — Encounter: Payer: Self-pay | Admitting: Podiatry

## 2024-03-09 ENCOUNTER — Ambulatory Visit (INDEPENDENT_AMBULATORY_CARE_PROVIDER_SITE_OTHER): Admitting: Podiatry

## 2024-03-09 DIAGNOSIS — G5761 Lesion of plantar nerve, right lower limb: Secondary | ICD-10-CM

## 2024-03-09 NOTE — Progress Notes (Signed)
   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  DOS: 02/19/2024.  Doing well.  WBAT cam boot  Past Medical History:  Diagnosis Date   Medical history non-contributory     Past Surgical History:  Procedure Laterality Date   foot Right    INDUCED ABORTION     TONSILLECTOMY      Allergies  Allergen Reactions   Red Dye #40 (Allura Red) Hives    Objective/Physical Exam Neurovascular status intact.  Incision well coapted with sutures intact. No sign of infectious process noted. No dehiscence. No active bleeding noted.  Minimal edema noted to the surgical extremity.  Radiographic Exam RT foot 02/27/2024:  Interval removal of orthopedic hardware to the first metatarsocuneiform.  No acute fractures identified or complicating features.  Assessment: 1. s/p ROH RT foot.  Morton's neurectomy third interspace right foot. DOS: 02/19/2024   Plan of Care:  -Patient was evaluated.  -Sutures removed -Okay to transition out of the cam boot into good supportive tennis shoes and sneakers -Compression ankle sleeve dispensed.  Wear daily -Return to clinic 4 weeks follow-up x-ray  Thresa EMERSON Sar, DPM [facility]  Dr. Thresa EMERSON Sar, DPM    [address] [place], [place] [address]                Office [phone]  Fax [phone]

## 2024-03-19 ENCOUNTER — Encounter: Admitting: Podiatry

## 2024-03-23 ENCOUNTER — Encounter: Admitting: Podiatry

## 2024-03-24 ENCOUNTER — Encounter: Payer: Self-pay | Admitting: Podiatry

## 2024-04-06 ENCOUNTER — Encounter: Payer: Self-pay | Admitting: Podiatry

## 2024-04-06 ENCOUNTER — Ambulatory Visit (INDEPENDENT_AMBULATORY_CARE_PROVIDER_SITE_OTHER): Admitting: Podiatry

## 2024-04-06 ENCOUNTER — Ambulatory Visit (INDEPENDENT_AMBULATORY_CARE_PROVIDER_SITE_OTHER)

## 2024-04-06 VITALS — Ht 65.0 in | Wt 235.0 lb

## 2024-04-06 DIAGNOSIS — G5761 Lesion of plantar nerve, right lower limb: Secondary | ICD-10-CM

## 2024-04-06 MED ORDER — IBUPROFEN 800 MG PO TABS: 800.0000 mg | ORAL_TABLET | Freq: Three times a day (TID) | ORAL | 1 refills | Status: DC

## 2024-04-06 NOTE — Progress Notes (Signed)
   Chief Complaint  Patient presents with   Routine Post Op    POV #3 DOS 02/19/24 RT 3RD INTERSPACE EXCISION MORTONS NEUROMA, RT POSSIBLE HARDWARE REMOVAL. She is super happy with her results. 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  DOS: 02/19/2024.  Doing well.  Currently in tennis shoes doing well and essentially full activity with no restrictions  Past Medical History:  Diagnosis Date   Medical history non-contributory     Past Surgical History:  Procedure Laterality Date   foot Right    INDUCED ABORTION     TONSILLECTOMY      Allergies  Allergen Reactions   Red Dye #40 (Allura Red) Hives    Objective/Physical Exam Neurovascular status intact.  Incision is nicely healed.  No edema noted today.  Slight sensitivity along the lateral column of the foot but overall doing well  Radiographic Exam RT foot 04/06/2024:  Routine healing noted.  Interval removal of orthopedic hardware to the first metatarsocuneiform.  No acute fractures identified or complicating features.  Assessment: 1. s/p ROH RT foot.  Morton's neurectomy third interspace right foot. DOS: 02/19/2024   Plan of Care:  -Patient was evaluated.  -Incisions are nicely healed.  She has returned to tennis shoes.  Doing well.  She may now essentially resume full activity no restrictions.   -Return to clinic PRN  Thresa EMERSON Sar, DPM [facility]  Dr. Thresa EMERSON Sar, DPM    [address] [place], [place] [address]                Office [phone]  Fax [phone]

## 2024-07-27 ENCOUNTER — Other Ambulatory Visit: Payer: Self-pay

## 2024-07-27 MED ORDER — IBUPROFEN 800 MG PO TABS: 800.0000 mg | ORAL_TABLET | Freq: Three times a day (TID) | ORAL | 1 refills | Status: AC
